# Patient Record
Sex: Male | Born: 1935
Health system: Southern US, Community
[De-identification: ages and names within clinical notes are randomized; demographics above are authoritative.]

## PROBLEM LIST (undated history)

## (undated) DIAGNOSIS — IMO0001 Reserved for inherently not codable concepts without codable children: Secondary | ICD-10-CM

## (undated) DIAGNOSIS — C801 Malignant (primary) neoplasm, unspecified: Secondary | ICD-10-CM

## (undated) DIAGNOSIS — I6529 Occlusion and stenosis of unspecified carotid artery: Secondary | ICD-10-CM

## (undated) DIAGNOSIS — F329 Major depressive disorder, single episode, unspecified: Secondary | ICD-10-CM

## (undated) DIAGNOSIS — I071 Rheumatic tricuspid insufficiency: Secondary | ICD-10-CM

## (undated) DIAGNOSIS — E785 Hyperlipidemia, unspecified: Secondary | ICD-10-CM

## (undated) DIAGNOSIS — I739 Peripheral vascular disease, unspecified: Secondary | ICD-10-CM

## (undated) DIAGNOSIS — I340 Nonrheumatic mitral (valve) insufficiency: Secondary | ICD-10-CM

## (undated) DIAGNOSIS — D649 Anemia, unspecified: Secondary | ICD-10-CM

## (undated) DIAGNOSIS — E7800 Pure hypercholesterolemia, unspecified: Secondary | ICD-10-CM

## (undated) DIAGNOSIS — F32A Depression, unspecified: Secondary | ICD-10-CM

## (undated) DIAGNOSIS — I10 Essential (primary) hypertension: Secondary | ICD-10-CM

## (undated) DIAGNOSIS — I5189 Other ill-defined heart diseases: Secondary | ICD-10-CM

## (undated) DIAGNOSIS — I700 Atherosclerosis of aorta: Secondary | ICD-10-CM

## (undated) DIAGNOSIS — I639 Cerebral infarction, unspecified: Secondary | ICD-10-CM

## (undated) HISTORY — DX: Cerebral infarction, unspecified: I63.9

## (undated) HISTORY — DX: Major depressive disorder, single episode, unspecified: F32.9

## (undated) HISTORY — PX: COLONOSCOPY: SHX174

## (undated) HISTORY — DX: Nonrheumatic mitral (valve) insufficiency: I34.0

## (undated) HISTORY — DX: Essential (primary) hypertension: I10

## (undated) HISTORY — PX: OTHER SURGICAL HISTORY: SHX169

## (undated) HISTORY — DX: Peripheral vascular disease, unspecified: I73.9

## (undated) HISTORY — DX: Hyperlipidemia, unspecified: E78.5

## (undated) HISTORY — DX: Other ill-defined heart diseases: I51.89

## (undated) HISTORY — DX: Depression, unspecified: F32.A

## (undated) HISTORY — DX: Reserved for inherently not codable concepts without codable children: IMO0001

## (undated) HISTORY — DX: Malignant (primary) neoplasm, unspecified: C80.1

## (undated) HISTORY — DX: Rheumatic tricuspid insufficiency: I07.1

## (undated) HISTORY — DX: Anemia, unspecified: D64.9

## (undated) HISTORY — DX: Atherosclerosis of aorta: I70.0

## (undated) HISTORY — DX: Occlusion and stenosis of unspecified carotid artery: I65.29

---
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
## 1978-05-18 HISTORY — PX: HERNIA REPAIR: SHX51

## 1999-12-17 ENCOUNTER — Ambulatory Visit (HOSPITAL_BASED_OUTPATIENT_CLINIC_OR_DEPARTMENT_OTHER): Admission: RE | Admit: 1999-12-17 | Discharge: 1999-12-17 | Payer: Self-pay | Admitting: Plastic Surgery

## 2000-01-28 ENCOUNTER — Ambulatory Visit (HOSPITAL_BASED_OUTPATIENT_CLINIC_OR_DEPARTMENT_OTHER): Admission: RE | Admit: 2000-01-28 | Discharge: 2000-01-28 | Payer: Self-pay | Admitting: Plastic Surgery

## 2000-01-28 ENCOUNTER — Encounter (INDEPENDENT_AMBULATORY_CARE_PROVIDER_SITE_OTHER): Payer: Self-pay | Admitting: *Deleted

## 2002-03-07 ENCOUNTER — Encounter: Admission: RE | Admit: 2002-03-07 | Discharge: 2002-06-05 | Payer: Self-pay | Admitting: Family Medicine

## 2002-04-07 ENCOUNTER — Encounter (INDEPENDENT_AMBULATORY_CARE_PROVIDER_SITE_OTHER): Payer: Self-pay

## 2002-04-07 ENCOUNTER — Ambulatory Visit (HOSPITAL_COMMUNITY): Admission: RE | Admit: 2002-04-07 | Discharge: 2002-04-07 | Payer: Self-pay | Admitting: Gastroenterology

## 2002-08-09 ENCOUNTER — Encounter: Admission: RE | Admit: 2002-08-09 | Discharge: 2002-11-07 | Payer: Self-pay | Admitting: Family Medicine

## 2005-09-25 ENCOUNTER — Emergency Department (HOSPITAL_COMMUNITY): Admission: EM | Admit: 2005-09-25 | Discharge: 2005-09-25 | Payer: Self-pay | Admitting: Emergency Medicine

## 2005-10-13 ENCOUNTER — Encounter: Admission: RE | Admit: 2005-10-13 | Discharge: 2005-10-13 | Payer: Self-pay | Admitting: Sports Medicine

## 2005-11-02 ENCOUNTER — Encounter: Admission: RE | Admit: 2005-11-02 | Discharge: 2005-11-02 | Payer: Self-pay | Admitting: Sports Medicine

## 2010-06-02 ENCOUNTER — Observation Stay (HOSPITAL_COMMUNITY)
Admission: EM | Admit: 2010-06-02 | Discharge: 2010-06-03 | Payer: Self-pay | Source: Home / Self Care | Attending: Emergency Medicine | Admitting: Emergency Medicine

## 2010-06-04 LAB — GLUCOSE, CAPILLARY: Glucose-Capillary: 167 mg/dL — ABNORMAL HIGH (ref 70–99)

## 2010-06-04 LAB — DIFFERENTIAL
Basophils Absolute: 0 10*3/uL (ref 0.0–0.1)
Basophils Relative: 0 % (ref 0–1)
Eosinophils Absolute: 0.2 10*3/uL (ref 0.0–0.7)
Eosinophils Relative: 2 % (ref 0–5)
Lymphocytes Relative: 19 % (ref 12–46)
Lymphs Abs: 1.3 10*3/uL (ref 0.7–4.0)
Monocytes Absolute: 0.4 10*3/uL (ref 0.1–1.0)
Monocytes Relative: 6 % (ref 3–12)
Neutro Abs: 4.9 10*3/uL (ref 1.7–7.7)
Neutrophils Relative %: 73 % (ref 43–77)

## 2010-06-04 LAB — COMPREHENSIVE METABOLIC PANEL
ALT: 17 U/L (ref 0–53)
AST: 17 U/L (ref 0–37)
Albumin: 3.6 g/dL (ref 3.5–5.2)
Alkaline Phosphatase: 42 U/L (ref 39–117)
BUN: 24 mg/dL — ABNORMAL HIGH (ref 6–23)
CO2: 23 mEq/L (ref 19–32)
Calcium: 9.1 mg/dL (ref 8.4–10.5)
Chloride: 103 mEq/L (ref 96–112)
Creatinine, Ser: 1.57 mg/dL — ABNORMAL HIGH (ref 0.4–1.5)
GFR calc Af Amer: 52 mL/min — ABNORMAL LOW (ref >60)
GFR calc non Af Amer: 43 mL/min — ABNORMAL LOW (ref >60)
Glucose, Bld: 153 mg/dL — ABNORMAL HIGH (ref 70–99)
Potassium: 4 mEq/L (ref 3.5–5.1)
Sodium: 140 mEq/L (ref 135–145)
Total Bilirubin: 0.2 mg/dL — ABNORMAL LOW (ref 0.3–1.2)
Total Protein: 6 g/dL (ref 6.0–8.3)

## 2010-06-04 LAB — CBC
HCT: 33.2 % — ABNORMAL LOW (ref 39.0–52.0)
Hemoglobin: 11.1 g/dL — ABNORMAL LOW (ref 13.0–17.0)
MCH: 30.5 pg (ref 26.0–34.0)
MCHC: 33.4 g/dL (ref 30.0–36.0)
MCV: 91.2 fL (ref 78.0–100.0)
Platelets: 265 10*3/uL (ref 150–400)
RBC: 3.64 MIL/uL — ABNORMAL LOW (ref 4.22–5.81)
RDW: 13.3 % (ref 11.5–15.5)
WBC: 6.8 10*3/uL (ref 4.0–10.5)

## 2010-06-04 LAB — URINALYSIS, ROUTINE W REFLEX MICROSCOPIC
Bilirubin Urine: NEGATIVE
Hgb urine dipstick: NEGATIVE
Ketones, ur: 15 mg/dL — AB
Nitrite: NEGATIVE
Protein, ur: NEGATIVE mg/dL
Specific Gravity, Urine: 1.013 (ref 1.005–1.030)
Urine Glucose, Fasting: NEGATIVE mg/dL
Urobilinogen, UA: 0.2 mg/dL (ref 0.0–1.0)
pH: 5.5 (ref 5.0–8.0)

## 2010-06-04 LAB — TROPONIN I: Troponin I: 0.04 ng/mL (ref 0.00–0.06)

## 2010-06-08 ENCOUNTER — Encounter: Payer: Self-pay | Admitting: Sports Medicine

## 2010-08-12 ENCOUNTER — Ambulatory Visit: Payer: Self-pay | Admitting: Cardiovascular Disease

## 2010-08-27 ENCOUNTER — Other Ambulatory Visit: Payer: Self-pay | Admitting: *Deleted

## 2010-08-27 DIAGNOSIS — E7800 Pure hypercholesterolemia, unspecified: Secondary | ICD-10-CM

## 2010-08-28 ENCOUNTER — Encounter: Payer: Self-pay | Admitting: Cardiovascular Disease

## 2010-08-28 DIAGNOSIS — R079 Chest pain, unspecified: Secondary | ICD-10-CM | POA: Insufficient documentation

## 2010-08-28 DIAGNOSIS — I350 Nonrheumatic aortic (valve) stenosis: Secondary | ICD-10-CM | POA: Insufficient documentation

## 2010-08-28 DIAGNOSIS — R0602 Shortness of breath: Secondary | ICD-10-CM | POA: Insufficient documentation

## 2010-08-28 DIAGNOSIS — I5189 Other ill-defined heart diseases: Secondary | ICD-10-CM | POA: Insufficient documentation

## 2010-08-28 DIAGNOSIS — E119 Type 2 diabetes mellitus without complications: Secondary | ICD-10-CM | POA: Insufficient documentation

## 2010-08-28 DIAGNOSIS — E785 Hyperlipidemia, unspecified: Secondary | ICD-10-CM | POA: Insufficient documentation

## 2010-08-28 DIAGNOSIS — I340 Nonrheumatic mitral (valve) insufficiency: Secondary | ICD-10-CM | POA: Insufficient documentation

## 2010-08-28 DIAGNOSIS — I071 Rheumatic tricuspid insufficiency: Secondary | ICD-10-CM | POA: Insufficient documentation

## 2010-08-28 DIAGNOSIS — I10 Essential (primary) hypertension: Secondary | ICD-10-CM | POA: Insufficient documentation

## 2010-08-29 ENCOUNTER — Other Ambulatory Visit (INDEPENDENT_AMBULATORY_CARE_PROVIDER_SITE_OTHER): Payer: Medicare Other | Admitting: *Deleted

## 2010-08-29 ENCOUNTER — Encounter: Payer: Self-pay | Admitting: Cardiovascular Disease

## 2010-08-29 ENCOUNTER — Ambulatory Visit (INDEPENDENT_AMBULATORY_CARE_PROVIDER_SITE_OTHER): Payer: Medicare Other | Admitting: Cardiovascular Disease

## 2010-08-29 DIAGNOSIS — E7800 Pure hypercholesterolemia, unspecified: Secondary | ICD-10-CM

## 2010-08-29 DIAGNOSIS — R079 Chest pain, unspecified: Secondary | ICD-10-CM

## 2010-08-29 LAB — LIPID PANEL
Cholesterol: 143 mg/dL (ref 0–200)
HDL: 64.9 mg/dL (ref >39.00)
LDL Cholesterol: 63 mg/dL (ref 0–99)
Total CHOL/HDL Ratio: 2
Triglycerides: 74 mg/dL (ref 0.0–149.0)
VLDL: 14.8 mg/dL (ref 0.0–40.0)

## 2010-08-29 LAB — HEPATIC FUNCTION PANEL
ALT: 23 U/L (ref 0–53)
AST: 23 U/L (ref 0–37)
Albumin: 4.2 g/dL (ref 3.5–5.2)
Alkaline Phosphatase: 44 U/L (ref 39–117)
Bilirubin, Direct: 0.1 mg/dL (ref 0.0–0.3)
Total Bilirubin: 0.9 mg/dL (ref 0.3–1.2)
Total Protein: 6.7 g/dL (ref 6.0–8.3)

## 2010-08-29 LAB — BASIC METABOLIC PANEL
BUN: 19 mg/dL (ref 6–23)
CO2: 28 mEq/L (ref 19–32)
Calcium: 9.6 mg/dL (ref 8.4–10.5)
Chloride: 102 mEq/L (ref 96–112)
Creatinine, Ser: 1.3 mg/dL (ref 0.4–1.5)
GFR: 55.94 mL/min — ABNORMAL LOW (ref >60.00)
Glucose, Bld: 112 mg/dL — ABNORMAL HIGH (ref 70–99)
Potassium: 4.3 mEq/L (ref 3.5–5.1)
Sodium: 140 mEq/L (ref 135–145)

## 2010-08-29 NOTE — Assessment & Plan Note (Signed)
Joshua York is no longer having any significant episodes of chest pain. I would like to continue with his same medications and I'll see him again in one year.

## 2010-08-29 NOTE — Progress Notes (Signed)
Joshua York Date of Birth  1928-06-12 [facility] [address]     [address] [place], [place]  [address] [phone]  Fax  [phone]  History of Present Illness:  Mr. Joshua York is an elderly gentleman with a history of atypical chest pains the past. He also has some shortness breath. He has normal left ventricular systolic function by echo. He's had a negative stress test. He's done quite well since I last saw him. He's not had any episodes of chest pain or shortness of breath.  Current Outpatient Prescriptions on File Prior to Visit  Medication Sig Dispense Refill  . amLODipine (NORVASC) 10 MG tablet Take 10 mg by mouth daily.        Marland Kitchen aspirin 81 MG tablet Take 81 mg by mouth daily.        Marland Kitchen glipiZIDE (GLUCOTROL) 10 MG tablet Take 10 mg by mouth daily.        . hydrochlorothiazide 25 MG tablet Take 25 mg by mouth daily.        Marland Kitchen latanoprost (XALATAN) 0.005 % ophthalmic solution 1 drop at bedtime.        . lovastatin (MEVACOR) 20 MG tablet Take 20 mg by mouth at bedtime.        . metFORMIN (GLUCOPHAGE) 500 MG tablet Take 500 mg by mouth daily. 2 DAILY       . olmesartan (BENICAR) 20 MG tablet Take 20 mg by mouth daily.        . pioglitazone (ACTOS) 30 MG tablet Take 30 mg by mouth daily.        . trandolapril (MAVIK) 4 MG tablet Take 4 mg by mouth daily.        Marland Kitchen venlafaxine (EFFEXOR-XR) 75 MG 24 hr capsule Take 75 mg by mouth daily.          Allergies  Allergen Reactions  . Iodinated Diagnostic Agents   . Shellfish-Derived Products     Past Medical History  Diagnosis Date  . Diabetes mellitus   . Hyperlipidemia   . Hypertension   . Dyspnea on exertion   . Chest pain   . SOB (shortness of breath)   . Diastolic dysfunction   . Mild aortic sclerosis   . MR (mitral regurgitation)     MILD  . TR (tricuspid regurgitation)     MILD    No past surgical history on file.  History  Smoking status  . Former Smoker  . Quit date:  08/27/1985  Smokeless tobacco  . Not on file    History  Alcohol Use No    Family History  Problem Relation Age of Onset  . Heart attack Father 16  . Other Mother 49    OLD AGE    Reviw of Systems:  Reviewed in the HPI.  All other systems are negative.  Physical Exam: BP 152/78  Pulse 58  Ht 5\' 9"  (1.753 m)  Wt 166 lb 3.2 oz (75.388 kg)  BMI 24.54 kg/m2 The patient is alert and oriented x 3.  The mood and affect are normal.  The skin is warm and dry.  Color is normal.  The HEENT exam reveals that the sclera are nonicteric.  The mucous membranes are moist.  The carotids are 2+ without bruits.  There is no thyromegaly.  There is no JVD.  The lungs are clear.  The chest wall is non tender.  The heart exam reveals a regular rate with  a normal S1 and S2.  There are no murmurs, gallops, or rubs.  The PMI is not displaced.   Abdominal exam reveals good bowel sounds.  There is no guarding or rebound.  There is no hepatosplenomegaly or tenderness.  There are no masses.  Exam of the legs reveal no clubbing, cyanosis, or edema.  The legs are without rashes.  The distal pulses are intact.  Cranial nerves II - XII are intact.  Motor and sensory functions are intact.  The gait is normal.  ECG: Normal sinus rhythm. He has T wave inversions in leads 3 and aVF which were unchanged from previous tracings. Assessment / Plan:

## 2010-09-01 ENCOUNTER — Telehealth: Payer: Self-pay | Admitting: *Deleted

## 2010-09-01 NOTE — Telephone Encounter (Signed)
Message copied by Mahalia Longest on Mon Sep 01, 2010 12:21 PM ------      Message from: [place], [place]      Created: Fri Aug 29, 2010  4:52 PM       Randie Heinz

## 2010-09-01 NOTE — Telephone Encounter (Signed)
Patient called with lab results. Copy mailed. Pt verbalized understanding. Alfonso Ramus RN

## 2010-10-03 NOTE — Op Note (Signed)
   NAME:  Joshua York, Joshua York                          ACCOUNT NO.:  [id number]   MEDICAL RECORD NO.:  [id number]                   PATIENT TYPE:  AMB   LOCATION:  ENDO                                 FACILITY:  [facility]   PHYSICIAN:  Bernette Redbird, M.D.                DATE OF BIRTH:  [birth year]   DATE OF PROCEDURE:  04/07/2002  DATE OF DISCHARGE:                                 OPERATIVE REPORT   PROCEDURE:  Colonoscopy with biopsy.   INDICATIONS FOR PROCEDURE:  A 77 year old gentleman with small polyp seen on  screening flexible sigmoidoscopy.   FINDINGS:  Essentially normal exam. Tiny hyperplastic appearing polyps in  the rectosigmoid area.   DESCRIPTION OF PROCEDURE:  The nature, purpose and risk of the procedure had  been reviewed with the patient who provided written consent. The Olympus  adjustable tension pediatric video colonoscope was advanced to the cecum as  identified by clear visualization of the appendiceal orifice and pullback  was then performed. The quality of the prep was excellent and it is felt  that all areas were well seen.   At about 18 cm, near the rectosigmoid junction, I encountered several tiny 2  mm hyperplastic appearing sessile polyps which were flattened out with  insufflation. I elected to biopsy several of these for histologic analysis  although it is anticipated they will probably be hyperplastic in character.  No large polyps, cancer, colitis or vascular malformations were observed.  Some mild diverticulosis may have been present but there was no extensive  diverticular change.   The patient tolerated the procedure well and there were no apparent  complications.   IMPRESSION:  Essentially normal examination. Tiny polyps biopsied as  described above.   PLAN:  Await pathology.                                               Bernette Redbird, M.D.    RB/MEDQ  D:  04/07/2002  T:  04/07/2002  Job:  [id number]   cc:   Caryn Bee L. Little, M.D.  [address]  [place]  [place] [address]  Fax: [phone]

## 2010-10-31 ENCOUNTER — Other Ambulatory Visit: Payer: Self-pay | Admitting: Family Medicine

## 2010-10-31 DIAGNOSIS — R55 Syncope and collapse: Secondary | ICD-10-CM

## 2010-11-03 ENCOUNTER — Ambulatory Visit (HOSPITAL_COMMUNITY): Payer: Medicare Other | Attending: Family Medicine

## 2010-11-03 DIAGNOSIS — E785 Hyperlipidemia, unspecified: Secondary | ICD-10-CM | POA: Insufficient documentation

## 2010-11-03 DIAGNOSIS — R55 Syncope and collapse: Secondary | ICD-10-CM | POA: Insufficient documentation

## 2010-11-03 DIAGNOSIS — R0609 Other forms of dyspnea: Secondary | ICD-10-CM | POA: Insufficient documentation

## 2010-11-03 DIAGNOSIS — I10 Essential (primary) hypertension: Secondary | ICD-10-CM | POA: Insufficient documentation

## 2010-11-03 DIAGNOSIS — R0989 Other specified symptoms and signs involving the circulatory and respiratory systems: Secondary | ICD-10-CM | POA: Insufficient documentation

## 2010-11-03 DIAGNOSIS — E119 Type 2 diabetes mellitus without complications: Secondary | ICD-10-CM | POA: Insufficient documentation

## 2010-11-03 DIAGNOSIS — I059 Rheumatic mitral valve disease, unspecified: Secondary | ICD-10-CM | POA: Insufficient documentation

## 2010-11-03 DIAGNOSIS — R079 Chest pain, unspecified: Secondary | ICD-10-CM | POA: Insufficient documentation

## 2010-11-03 DIAGNOSIS — I079 Rheumatic tricuspid valve disease, unspecified: Secondary | ICD-10-CM | POA: Insufficient documentation

## 2010-11-04 ENCOUNTER — Other Ambulatory Visit (HOSPITAL_COMMUNITY): Payer: Self-pay | Admitting: Radiology

## 2010-11-04 ENCOUNTER — Encounter: Payer: Self-pay | Admitting: Cardiology

## 2010-11-04 ENCOUNTER — Encounter (HOSPITAL_COMMUNITY): Payer: Self-pay | Admitting: Cardiovascular Disease

## 2010-11-04 DIAGNOSIS — R55 Syncope and collapse: Secondary | ICD-10-CM

## 2010-11-06 NOTE — Progress Notes (Signed)
Pt informed of normal results.

## 2010-11-07 ENCOUNTER — Ambulatory Visit
Admission: RE | Admit: 2010-11-07 | Discharge: 2010-11-07 | Disposition: A | Discharge status: Home / Self Care | Payer: Medicare Other | Source: Ambulatory Visit | Attending: Family Medicine | Admitting: Family Medicine

## 2010-11-07 DIAGNOSIS — R55 Syncope and collapse: Secondary | ICD-10-CM

## 2010-11-25 ENCOUNTER — Other Ambulatory Visit: Payer: Self-pay | Admitting: Dermatology

## 2010-12-11 ENCOUNTER — Other Ambulatory Visit: Payer: Self-pay | Admitting: Neurology

## 2010-12-11 DIAGNOSIS — R55 Syncope and collapse: Secondary | ICD-10-CM

## 2010-12-16 ENCOUNTER — Ambulatory Visit
Admission: RE | Admit: 2010-12-16 | Discharge: 2010-12-16 | Disposition: A | Discharge status: Home / Self Care | Payer: Medicare Other | Source: Ambulatory Visit | Attending: Neurology | Admitting: Neurology

## 2010-12-16 DIAGNOSIS — R55 Syncope and collapse: Secondary | ICD-10-CM

## 2011-03-22 ENCOUNTER — Other Ambulatory Visit: Payer: Self-pay

## 2011-03-22 ENCOUNTER — Emergency Department (HOSPITAL_COMMUNITY): Payer: Medicare Other

## 2011-03-22 ENCOUNTER — Encounter (HOSPITAL_COMMUNITY): Payer: Self-pay | Admitting: Nurse Practitioner

## 2011-03-22 ENCOUNTER — Inpatient Hospital Stay (HOSPITAL_COMMUNITY)
Admission: EM | Admit: 2011-03-22 | Discharge: 2011-03-23 | DRG: 101 | Disposition: A | Discharge status: Home / Self Care | Payer: Medicare Other | Attending: Internal Medicine | Admitting: Internal Medicine

## 2011-03-22 DIAGNOSIS — I340 Nonrheumatic mitral (valve) insufficiency: Secondary | ICD-10-CM | POA: Diagnosis present

## 2011-03-22 DIAGNOSIS — I679 Cerebrovascular disease, unspecified: Secondary | ICD-10-CM | POA: Diagnosis present

## 2011-03-22 DIAGNOSIS — I2510 Atherosclerotic heart disease of native coronary artery without angina pectoris: Secondary | ICD-10-CM | POA: Diagnosis present

## 2011-03-22 DIAGNOSIS — E785 Hyperlipidemia, unspecified: Secondary | ICD-10-CM | POA: Diagnosis present

## 2011-03-22 DIAGNOSIS — E119 Type 2 diabetes mellitus without complications: Secondary | ICD-10-CM | POA: Diagnosis present

## 2011-03-22 DIAGNOSIS — I079 Rheumatic tricuspid valve disease, unspecified: Secondary | ICD-10-CM | POA: Diagnosis present

## 2011-03-22 DIAGNOSIS — I059 Rheumatic mitral valve disease, unspecified: Secondary | ICD-10-CM | POA: Diagnosis present

## 2011-03-22 DIAGNOSIS — IMO0001 Reserved for inherently not codable concepts without codable children: Secondary | ICD-10-CM | POA: Diagnosis present

## 2011-03-22 DIAGNOSIS — I10 Essential (primary) hypertension: Secondary | ICD-10-CM | POA: Diagnosis present

## 2011-03-22 DIAGNOSIS — R4182 Altered mental status, unspecified: Secondary | ICD-10-CM | POA: Diagnosis present

## 2011-03-22 DIAGNOSIS — Z66 Do not resuscitate: Secondary | ICD-10-CM | POA: Diagnosis present

## 2011-03-22 DIAGNOSIS — I350 Nonrheumatic aortic (valve) stenosis: Secondary | ICD-10-CM | POA: Diagnosis present

## 2011-03-22 DIAGNOSIS — R569 Unspecified convulsions: Principal | ICD-10-CM | POA: Diagnosis present

## 2011-03-22 DIAGNOSIS — Z888 Allergy status to other drugs, medicaments and biological substances status: Secondary | ICD-10-CM

## 2011-03-22 DIAGNOSIS — R0609 Other forms of dyspnea: Secondary | ICD-10-CM | POA: Diagnosis not present

## 2011-03-22 DIAGNOSIS — I359 Nonrheumatic aortic valve disorder, unspecified: Secondary | ICD-10-CM | POA: Diagnosis present

## 2011-03-22 DIAGNOSIS — I071 Rheumatic tricuspid insufficiency: Secondary | ICD-10-CM | POA: Diagnosis present

## 2011-03-22 DIAGNOSIS — G459 Transient cerebral ischemic attack, unspecified: Secondary | ICD-10-CM | POA: Diagnosis present

## 2011-03-22 DIAGNOSIS — I5189 Other ill-defined heart diseases: Secondary | ICD-10-CM | POA: Diagnosis not present

## 2011-03-22 DIAGNOSIS — Z87891 Personal history of nicotine dependence: Secondary | ICD-10-CM

## 2011-03-22 HISTORY — DX: Pure hypercholesterolemia, unspecified: E78.00

## 2011-03-22 LAB — CBC
HCT: 34 % — ABNORMAL LOW (ref 39.0–52.0)
Hemoglobin: 12 g/dL — ABNORMAL LOW (ref 13.0–17.0)
MCH: 31.9 pg (ref 26.0–34.0)
MCHC: 35.3 g/dL (ref 30.0–36.0)
MCHC: 34.5 g/dL (ref 30.0–36.0)
MCV: 90.4 fL (ref 78.0–100.0)
RDW: 12.8 % (ref 11.5–15.5)
RDW: 12.7 % (ref 11.5–15.5)
WBC: 9.9 10*3/uL (ref 4.0–10.5)

## 2011-03-22 LAB — DIFFERENTIAL
Basophils Relative: 0 % (ref 0–1)
Eosinophils Relative: 2 % (ref 0–5)
Monocytes Absolute: 0.6 10*3/uL (ref 0.1–1.0)
Monocytes Relative: 5 % (ref 3–12)
Neutro Abs: 9.4 10*3/uL — ABNORMAL HIGH (ref 1.7–7.7)

## 2011-03-22 LAB — COMPREHENSIVE METABOLIC PANEL
Albumin: 4.3 g/dL (ref 3.5–5.2)
BUN: 24 mg/dL — ABNORMAL HIGH (ref 6–23)
CO2: 27 mEq/L (ref 19–32)
Calcium: 10.3 mg/dL (ref 8.4–10.5)
Chloride: 93 mEq/L — ABNORMAL LOW (ref 96–112)
Creatinine, Ser: 1.21 mg/dL (ref 0.50–1.35)
GFR calc non Af Amer: 55 mL/min — ABNORMAL LOW (ref >90)
Total Bilirubin: 0.2 mg/dL — ABNORMAL LOW (ref 0.3–1.2)

## 2011-03-22 LAB — TROPONIN I: Troponin I: <0.3 ng/mL (ref <0.30)

## 2011-03-22 LAB — GLUCOSE, CAPILLARY
Glucose-Capillary: 166 mg/dL — ABNORMAL HIGH (ref 70–99)
Glucose-Capillary: 137 mg/dL — ABNORMAL HIGH (ref 70–99)

## 2011-03-22 MED ORDER — GLIPIZIDE 10 MG PO TABS
10.0000 mg | ORAL_TABLET | ORAL | Status: DC
  Administered 2011-03-23: 10 mg via ORAL
  Filled 2011-03-22 (×2): qty 1

## 2011-03-22 MED ORDER — ENOXAPARIN SODIUM 40 MG/0.4ML ~~LOC~~ SOLN
40.0000 mg | SUBCUTANEOUS | Status: DC
  Administered 2011-03-23: 40 mg via SUBCUTANEOUS
  Filled 2011-03-22 (×3): qty 0.4

## 2011-03-22 MED ORDER — SIMVASTATIN 10 MG PO TABS
10.0000 mg | ORAL_TABLET | Freq: Every day | ORAL | Status: DC
  Administered 2011-03-22: 10 mg via ORAL
  Filled 2011-03-22 (×2): qty 1

## 2011-03-22 MED ORDER — SODIUM CHLORIDE 0.9 % IJ SOLN
3.0000 mL | Freq: Two times a day (BID) | INTRAMUSCULAR | Status: DC
  Administered 2011-03-22 – 2011-03-23 (×2): 3 mL via INTRAVENOUS

## 2011-03-22 MED ORDER — LATANOPROST 0.005 % OP SOLN
1.0000 [drp] | Freq: Every day | OPHTHALMIC | Status: DC
  Administered 2011-03-22: 1 [drp] via OPHTHALMIC
  Filled 2011-03-22: qty 2.5

## 2011-03-22 MED ORDER — ASPIRIN 325 MG PO TABS
325.0000 mg | ORAL_TABLET | Freq: Every evening | ORAL | Status: DC
  Filled 2011-03-22: qty 1

## 2011-03-22 MED ORDER — SODIUM CHLORIDE 0.9 % IJ SOLN: 3.0000 mL | INTRAMUSCULAR | Status: DC | PRN

## 2011-03-22 MED ORDER — PIOGLITAZONE HCL 30 MG PO TABS
30.0000 mg | ORAL_TABLET | ORAL | Status: DC
  Administered 2011-03-23: 30 mg via ORAL
  Filled 2011-03-22 (×2): qty 1

## 2011-03-22 NOTE — ED Notes (Addendum)
Per ems: pt wife states slurred speech and altered mental status onset this afternoon and then she witnessed a seizure. Seizure approx 10-15 seconds, pt reports new seizures over past months with no clinical diagnosis. A&Ox4 en route, no seizure activity en route, no injuries noted.

## 2011-03-22 NOTE — ED Provider Notes (Addendum)
History     CSN: [id number] Arrival date & time: 03/22/2011  6:29 PM   First MD Initiated Contact with Patient 03/22/11 1849      Chief Complaint  Patient presents with  . Seizures    (Consider location/radiation/quality/duration/timing/severity/associated sxs/prior treatment) Patient is a 77 y.o. male presenting with seizures. The history is provided by the patient and the spouse.  Seizures  This is a new problem. The current episode started less than 1 hour ago. The problem has been resolved. There was 1 seizure. The most recent episode lasted less than 30 seconds. Associated symptoms include confusion. Pertinent negatives include no sleepiness, no headaches, no visual disturbance, no neck stiffness, no chest pain, no cough, no nausea, no vomiting and no diarrhea. Characteristics include bladder incontinence, rhythmic jerking and loss of consciousness. Characteristics do not include eye blinking, apnea or cyanosis. The episode was witnessed. There was no sensation of an aura present. The seizures did not continue in the ED. The seizure(s) had no focality. Possible causes do not include med or dosage change.  Prior to episode pt reports that he had abrupt onset of slurred speech, inability to remove pants to go to bathroom, then began having seizure.    Past Medical History  Diagnosis Date  . Diabetes mellitus   . Hyperlipidemia   . Hypertension   . Dyspnea on exertion   . Chest pain   . SOB (shortness of breath)   . Diastolic dysfunction   . Mild aortic sclerosis   . MR (mitral regurgitation)     MILD  . TR (tricuspid regurgitation)     MILD  . Glaucoma   . Hypercholesteremia     History reviewed. No pertinent past surgical history.  Family History  Problem Relation Age of Onset  . Heart attack Father 64  . Other Mother 85    OLD AGE    History  Substance Use Topics  . Smoking status: Former Smoker    Quit date: 08/27/1985  . Smokeless tobacco: Not on file  .  Alcohol Use: No      Review of Systems  Constitutional: Positive for activity change. Negative for fever.  HENT: Negative for neck stiffness.   Eyes: Negative for visual disturbance.  Respiratory: Negative for apnea and cough.   Cardiovascular: Negative for chest pain and cyanosis.  Gastrointestinal: Negative for nausea, vomiting and diarrhea.  Genitourinary: Positive for bladder incontinence.  Skin: Negative for rash.  Neurological: Positive for seizures and loss of consciousness. Negative for headaches.  Psychiatric/Behavioral: Positive for confusion.  All other systems reviewed and are negative.    Allergies  Iodinated diagnostic agents and Shellfish-derived products  Home Medications   Current Outpatient Rx  Name Route Sig Dispense Refill  . AMLODIPINE BESYLATE 10 MG PO TABS Oral Take 10 mg by mouth every morning.     . ASPIRIN 325 MG PO TABS Oral Take 325 mg by mouth every evening.      Marland Kitchen GLIPIZIDE 10 MG PO TABS Oral Take 10 mg by mouth every morning.     Marland Kitchen HYDROCHLOROTHIAZIDE 25 MG PO TABS Oral Take 25 mg by mouth every morning.     Marland Kitchen LATANOPROST 0.005 % OP SOLN Both Eyes Place 1 drop into both eyes at bedtime.     Marland Kitchen LOVASTATIN 20 MG PO TABS Oral Take 20 mg by mouth every evening.     Marland Kitchen METFORMIN HCL 500 MG PO TABS Oral Take 1,000 mg by mouth 2 (two) times daily.     Marland Kitchen  OLMESARTAN MEDOXOMIL 20 MG PO TABS Oral Take 20 mg by mouth every morning.     Marland Kitchen PIOGLITAZONE HCL 30 MG PO TABS Oral Take 30 mg by mouth every morning.     . TRANDOLAPRIL 4 MG PO TABS Oral Take 4 mg by mouth every morning.       BP 135/58  Pulse 76  Temp(Src) 97.8 F (36.6 C) (Oral)  Resp 14  SpO2 98%  Physical Exam  Nursing note and vitals reviewed. Constitutional: He is oriented to person, place, and time. He appears well-developed and well-nourished. No distress.  HENT:  Head: Normocephalic and atraumatic.  Eyes: EOM are normal. Pupils are equal, round, and reactive to light.  Neck: Normal  range of motion. Neck supple.  Cardiovascular: Normal rate and regular rhythm.   Pulmonary/Chest: Effort normal and breath sounds normal. No respiratory distress.  Abdominal: Soft. There is no tenderness.  Musculoskeletal: Normal range of motion. He exhibits no edema and no tenderness.  Neurological: He is alert and oriented to person, place, and time. No cranial nerve deficit.  Skin: Skin is warm and dry. No erythema.    ED Course  Procedures (including critical care time)  Labs Reviewed  CBC - Abnormal; Notable for the following:    WBC 11.3 (*)    RBC 3.76 (*)    Hemoglobin 12.0 (*)    HCT 34.0 (*)    All other components within normal limits  DIFFERENTIAL - Abnormal; Notable for the following:    Neutrophils Relative 83 (*)    Neutro Abs 9.4 (*)    Lymphocytes Relative 9 (*)    All other components within normal limits  COMPREHENSIVE METABOLIC PANEL - Abnormal; Notable for the following:    Sodium 132 (*)    Chloride 93 (*)    Glucose, Bld 175 (*)    BUN 24 (*)    Total Bilirubin 0.2 (*)    GFR calc non Af Amer 55 (*)    GFR calc Af Amer 63 (*)    All other components within normal limits  GLUCOSE, CAPILLARY - Abnormal; Notable for the following:    Glucose-Capillary 166 (*)    All other components within normal limits  POCT CBG MONITORING   Ct Head Wo Contrast  03/22/2011  *RADIOLOGY REPORT*  Clinical Data: Seizure  CT HEAD WITHOUT CONTRAST  Technique:  Contiguous axial images were obtained from the base of the skull through the vertex without contrast.  Comparison: 11/07/2010  Findings: Global atrophy and chronic ischemic changes are stable. There is no mass effect, midline shift, or acute intracranial hemorrhage.  Mastoid air cells and visualized paranasal sinuses are clear.  IMPRESSION: No acute intracranial pathology.  Chronic changes.  Original Report Authenticated By: Donavan Burnet, M.D.     1. Transient ischemic attack       MDM  Pt presented due to  slurred speech abrupt onset that has no resolved, weakness, unable to undo pants and seizure like activity with incontinence.  Hx of similar episodes has seen neuro in past with workup concerned for posterior circulation problems per pt.  Labs unremarkable, CT head neg.  Consulted hospitalist.  They will admit pt        Jacqulynn Cadet, MD Resident 03/22/11 1610  Jacqulynn Cadet, MD Resident 03/22/11 [phone]

## 2011-03-22 NOTE — H&P (Signed)
PCP:  Mickie Hillier, MD   DOA:  03/22/2011  6:29 PM  Chief Complaint:  -Slurring of speech with blurring of vision and unsteady gait x 1 day  -shaking with urinary incontinence x 1 day  HPI: Joshua York is an 77 y/o pleasant male with hx fo HTN, Type DM ( on oral hypoglycemics) , gr 1 diastolic dysfunction, Hyperlipidemia who was in his usual this health this evening when he noticed blurry vision associated with slurring of speech while he was sitting on a couch. He felt very weak and tried getting up to go to bed but was unsteady and during this time had jerky movements of his hands and also had urinary incontinence. Patient is able to recall the whole incident. He informs that these symptoms of blurry vision with slurred speech lasted approximately 10 minutes and had a very short episode of shaking of his hands. He denies any fall or trauma. Denies any weakness of his arms or legs thereafter. He denies any dizziness except for feeling unsteady. denies any headache , nausea, vomiting, photophobia, tinnitus or diplopia. He denies any chest pain, SOB, palpitations, abdominal pain . Patient and his wife who was present at bedside during conversation inform that he had similar symptoms of unsteadiness and ? syncope earlier this year in January and was admitted here and thought to be due to dehydration. He had similar episode of with drooling of saliva back in June for which he had head CT and MRI done along with ? EEG by his neurologist ( Dr Anne Hahn) but is unaware of the results. He denies drooling of saliva or tongue bite during the event. He denies having any urinary incontinence in past although does have symptoms of urgency, hesitancy and nocturia. . He had a light breakfast this morning and says his fingersticks run between 90-120s. He had his blood sugar checked by EMT and reported to be 78.  Patient denies any symptoms at this time. He informs taking good fluid intake and being compliant with diet  or medications. He informs having some URI symptoms with runny nose but denies any fever or headache.   Allergies: Allergies  Allergen Reactions  . Iodinated Diagnostic Agents   . Shellfish-Derived Products     Prior to Admission medications   Medication Sig Start Date End Date Taking? Authorizing Provider  amLODipine (NORVASC) 10 MG tablet Take 10 mg by mouth every morning.    Yes Historical Provider, MD  aspirin 325 MG tablet Take 325 mg by mouth every evening.     Yes Historical Provider, MD  glipiZIDE (GLUCOTROL) 10 MG tablet Take 10 mg by mouth every morning.    Yes Historical Provider, MD  hydrochlorothiazide 25 MG tablet Take 25 mg by mouth every morning.    Yes Historical Provider, MD  latanoprost (XALATAN) 0.005 % ophthalmic solution Place 1 drop into both eyes at bedtime.    Yes Historical Provider, MD  lovastatin (MEVACOR) 20 MG tablet Take 20 mg by mouth every evening.    Yes Historical Provider, MD  metFORMIN (GLUCOPHAGE) 500 MG tablet Take 1,000 mg by mouth 2 (two) times daily.    Yes Historical Provider, MD  olmesartan (BENICAR) 20 MG tablet Take 20 mg by mouth every morning.    Yes Historical Provider, MD  pioglitazone (ACTOS) 30 MG tablet Take 30 mg by mouth every morning.    Yes Historical Provider, MD  trandolapril (MAVIK) 4 MG tablet Take 4 mg by mouth every morning.  Yes Historical Provider, MD    Past Medical History  Diagnosis Date  . Diabetes mellitus   . Hyperlipidemia   . Hypertension   . Dyspnea on exertion   . Chest pain   . SOB (shortness of breath)   . Diastolic dysfunction   . Mild aortic sclerosis   . Joshua (mitral regurgitation)     MILD  . TR (tricuspid regurgitation)     MILD  . Glaucoma   . Hypercholesteremia     Past surgical hx:  hernia repair remotely Hx of fracture of left cheek bone with sx several yrs back  Social History: Smoked 1 and half PPD for over 10 yrs,  reports that he quit smoking about 25 years ago.drinks 1-2 beer  daily. deneis drinking today. Lives with his wife. He is fairly active and walks a mile daily.  Family History  Problem Relation Age of Onset  . Heart attack Father 30  . Other Mother 57    OLD AGE    Review of Systems:  Constitutional: Denies fever, chills, diaphoresis, appetite change   HEENT: Denies photophobia, eye pain, redness, hearing loss, ear pain, congestion, sore throat,, sneezing, mouth sores, trouble swallowing, neck pain, neck stiffness and tinnitus.   Respiratory: Denies SOB, DOE, cough, chest tightness,  and wheezing.   Cardiovascular: Denies chest pain, palpitations and leg swelling.  Gastrointestinal: Denies nausea, vomiting, abdominal pain, diarrhea, constipation, blood in stool and abdominal distention.  Genitourinary: Denies dysuria,  hematuria, flank pain .  Musculoskeletal: Denies myalgias, back pain, joint swelling, arthralgias and gait problem.  Skin: Denies pallor, rash and wound.  Neurological: Denies dizziness, , syncope, , light-headedness, numbness and headaches.     Physical Exam:  Filed Vitals:   03/22/11 1845 03/22/11 2056  BP: 135/58 127/84  Pulse: 76 75  Temp: 97.8 F (36.6 C) 98 F (36.7 C)  TempSrc: Oral Oral  Resp: 14 21  SpO2: 98% 97%    Constitutional: Vital signs reviewed.  Patient is a well-developed and well-nourished in no acute distress  HEENT: no pallor, no icterus, PEERLA, EOMI, moist oral mucosa Cardiovascular: RRR, S1 normal, S2 normal, no Murmurs , rubs or gallop Pulmonary/Chest: CTAB, no wheezes, rales, or rhonchi Abdominal: Soft. Non-tender, non-distended, bowel sounds are normal,  ZOX:WRUE , no edema Neurological: A&O x3, Strenght is normal and symmetric bilaterally, normal tone and reflexes, cranial nerve II-XII are grossly intact, no focal motor deficit, normal sensations, cerebellar fn intact, gait steady, rhomberg's sign negative, no neck rigidity   Labs on Admission:  Results for orders placed during the hospital  encounter of 03/22/11 (from the past 48 hour(s))  CBC     Status: Abnormal   Collection Time   03/22/11  7:37 PM      Component Value Range Comment   WBC 11.3 (*) 4.0 - 10.5 (K/uL)    RBC 3.76 (*) 4.22 - 5.81 (MIL/uL)    Hemoglobin 12.0 (*) 13.0 - 17.0 (g/dL)    HCT 45.4 (*) 09.8 - 52.0 (%)    MCV 90.4  78.0 - 100.0 (fL)    MCH 31.9  26.0 - 34.0 (pg)    MCHC 35.3  30.0 - 36.0 (g/dL)    RDW 11.9  14.7 - 82.9 (%)    Platelets 252  150 - 400 (K/uL)   DIFFERENTIAL     Status: Abnormal   Collection Time   03/22/11  7:37 PM      Component Value Range Comment   Neutrophils Relative  83 (*) 43 - 77 (%)    Neutro Abs 9.4 (*) 1.7 - 7.7 (K/uL)    Lymphocytes Relative 9 (*) 12 - 46 (%)    Lymphs Abs 1.0  0.7 - 4.0 (K/uL)    Monocytes Relative 5  3 - 12 (%)    Monocytes Absolute 0.6  0.1 - 1.0 (K/uL)    Eosinophils Relative 2  0 - 5 (%)    Eosinophils Absolute 0.2  0.0 - 0.7 (K/uL)    Basophils Relative 0  0 - 1 (%)    Basophils Absolute 0.0  0.0 - 0.1 (K/uL)   COMPREHENSIVE METABOLIC PANEL     Status: Abnormal   Collection Time   03/22/11  7:37 PM      Component Value Range Comment   Sodium 132 (*) 135 - 145 (mEq/L)    Potassium 5.0  3.5 - 5.1 (mEq/L)    Chloride 93 (*) 96 - 112 (mEq/L)    CO2 27  19 - 32 (mEq/L)    Glucose, Bld 175 (*) 70 - 99 (mg/dL)    BUN 24 (*) 6 - 23 (mg/dL)    Creatinine, Ser 1.61  0.50 - 1.35 (mg/dL)    Calcium 09.6  8.4 - 10.5 (mg/dL)    Total Protein 7.0  6.0 - 8.3 (g/dL)    Albumin 4.3  3.5 - 5.2 (g/dL)    AST 20  0 - 37 (U/L)    ALT 18  0 - 53 (U/L)    Alkaline Phosphatase 51  39 - 117 (U/L)    Total Bilirubin 0.2 (*) 0.3 - 1.2 (mg/dL)    GFR calc non Af Amer 55 (*) >90 (mL/min)    GFR calc Af Amer 63 (*) >90 (mL/min)   GLUCOSE, CAPILLARY     Status: Abnormal   Collection Time   03/22/11  7:45 PM      Component Value Range Comment   Glucose-Capillary 166 (*) 70 - 99 (mg/dL)   GLUCOSE, CAPILLARY     Status: Abnormal   Collection Time   03/22/11  9:34  PM      Component Value Range Comment   Glucose-Capillary 137 (*) 70 - 99 (mg/dL)     Radiological Exams on Admission: HEAD CT: no acute neurological abnormality  EKG: NSR @ 79 TWI in III and aVF   Assessment: 77 y/o male with hx of HTN, HL, DM type 2 , grade 1 diastolic dysfunction on recent echo presenting with blurring of vision associated with slurry speech lasting about 10 minutes along with jerky movements and urinary  incontinence. vitals  and initial labs including EKG unremarkable and head CT negative for acute intracranial event. patient admitted to medical telemetry for r/o acute seizures vs TIA.  PLAN:  ? Acute seizures:  -patient to be admitted to medical floor on telemetry -Will monitor closely under seizures precautions - unclear underlying cause of seizures  But he does give hx of of similar symptoms ( with confusion and drooliong of saliva) few months back with unremarkable 2 D echo, carotifd doppler and MRI/ MRA . The MRI done did mention of a chronic hemorrhage over left internal capsule. He infomrs having an EEG done by his neurologist, which i do not see in our system.  -i will hold off on any anti AED at this time and monitor for now. - i will order an EEG and have also called neurology on call who will evalaute patient tomorrow. recommended getting MRI brain  aling with MRA head and neck which i have ordered.  -there is a possibility of his symptms to be related to hypoglycemia with his fsg of 78 as moniotred by EMS and needs to be closely monitored. i will order an A1C level. -will check a UA as well.   ? TIA -Admit to telemetry  -will check for cardiac enzymes  -continue ASA -Continue lovastatin ( substituted with zocor) -Holding BP meds for now to allow permissive HTN until MRI done -neurochecksq as per protocol -MRI brain, MRA head and neck. -Will hold off on echo and carotid doppler as they were recently done without significant findings.  -neurology eval  in am  Diabetes mellitus type 2  -monitor POCs closely - cont actos and glipizide  -holding metformin  -A1C in am -Lipid panel in am  HTN: stable, holding BP meds for now for permissive HTN with concern for CVA  Hyperlipidemia  check lipid panel in am labs -cont zocor while in hospital  DVT prophylaxis:  Sq lovenox  Diet: cardiac   Code status: DNR/ DNI ( as confirmed by patient and his wife)  Primary contact : Kathie Rhodes Jhaveri ( wife) [phone]   Time Spent on Admission: 45 minutes  Suhana Wilner 03/22/2011, 10:17 PM

## 2011-03-22 NOTE — ED Notes (Signed)
Seizure pads placed on bed

## 2011-03-22 NOTE — ED Notes (Signed)
Pt ambulated to bathroom w/ tech

## 2011-03-22 NOTE — ED Notes (Signed)
Admitting physician at bedside

## 2011-03-23 ENCOUNTER — Other Ambulatory Visit (HOSPITAL_COMMUNITY): Payer: Medicare Other

## 2011-03-23 ENCOUNTER — Inpatient Hospital Stay (HOSPITAL_COMMUNITY): Payer: Medicare Other

## 2011-03-23 ENCOUNTER — Other Ambulatory Visit: Payer: Self-pay | Admitting: Internal Medicine

## 2011-03-23 ENCOUNTER — Encounter (HOSPITAL_COMMUNITY): Payer: Self-pay | Admitting: *Deleted

## 2011-03-23 DIAGNOSIS — R4182 Altered mental status, unspecified: Secondary | ICD-10-CM | POA: Diagnosis present

## 2011-03-23 DIAGNOSIS — I679 Cerebrovascular disease, unspecified: Secondary | ICD-10-CM | POA: Diagnosis present

## 2011-03-23 LAB — TROPONIN I: Troponin I: <0.3 ng/mL (ref <0.30)

## 2011-03-23 LAB — CREATININE, SERUM
Creatinine, Ser: 1.16 mg/dL (ref 0.50–1.35)
GFR calc non Af Amer: 58 mL/min — ABNORMAL LOW (ref >90)

## 2011-03-23 LAB — URINALYSIS, ROUTINE W REFLEX MICROSCOPIC
Glucose, UA: NEGATIVE mg/dL
Specific Gravity, Urine: 1.006 (ref 1.005–1.030)
pH: 7 (ref 5.0–8.0)

## 2011-03-23 LAB — GLUCOSE, CAPILLARY
Glucose-Capillary: 112 mg/dL — ABNORMAL HIGH (ref 70–99)
Glucose-Capillary: 94 mg/dL (ref 70–99)
Glucose-Capillary: 171 mg/dL — ABNORMAL HIGH (ref 70–99)

## 2011-03-23 MED ORDER — GADOBENATE DIMEGLUMINE 529 MG/ML IV SOLN
15.0000 mL | Freq: Once | INTRAVENOUS | Status: AC
  Administered 2011-03-23: 15 mL via INTRAVENOUS

## 2011-03-23 NOTE — Discharge Summary (Signed)
DISCHARGE SUMMARY  Joshua York  MR#: [id number]  DOB:1928-06-19  Date of Admission: 03/22/2011 Date of Discharge: 03/23/2011  Attending Physician:Aristea Posada K  Patient's NWG:[id number],YQMVH Juel Burrow, MD  Consults:  neurology for EEG  Discharge Diagnoses: Present on Admission:  .Hyperlipidemia .Hypertension .Aortic stenosis .Mild aortic sclerosis .MR (mitral regurgitation) .TR (tricuspid regurgitation) .Diabetes mellitus type II, controlled .Seizures .Altered mental status .Cerebrovascular disease    Current Discharge Medication List    CONTINUE these medications which have NOT CHANGED   Details  amLODipine (NORVASC) 10 MG tablet Take 10 mg by mouth every morning.     aspirin 325 MG tablet Take 325 mg by mouth every evening.      glipiZIDE (GLUCOTROL) 10 MG tablet Take 10 mg by mouth every morning.     hydrochlorothiazide 25 MG tablet Take 25 mg by mouth every morning.     latanoprost (XALATAN) 0.005 % ophthalmic solution Place 1 drop into both eyes at bedtime.     lovastatin (MEVACOR) 20 MG tablet Take 20 mg by mouth every evening.     metFORMIN (GLUCOPHAGE) 500 MG tablet Take 1,000 mg by mouth 2 (two) times daily.     olmesartan (BENICAR) 20 MG tablet Take 20 mg by mouth every morning.     pioglitazone (ACTOS) 30 MG tablet Take 30 mg by mouth every morning.     trandolapril (MAVIK) 4 MG tablet Take 4 mg by mouth every morning.           Hospital Course: Present on Admission:  .Hyperlipidemia .Hypertension .Aortic stenosis .Mild aortic sclerosis .MR (mitral regurgitation) .TR (tricuspid regurgitation) .Diabetes mellitus type II, controlled .Seizures .TIA (transient ischemic attack) .Altered mental status .Cerebrovascular disease: Patient is a 77 year old white male with past medical history of CAD and cerebrovascular disease who presented with an episode similar to several months ago where he had a brief period of altered mental status,  unresponsiveness and arm jerking movements) for seizure versus contracture. He was admitted for TIA rule out. MRI, MRA and CT were done as well carotid Dopplers. The workup for stroke was negative. Was in his pressures were stable. The only concerning factor was his MRA noted left MCA near complete occlusion. This is unchanged from his previous studies done in July. No other causes were found including no signs of any infection, dehydration, electrolyte abnormalities. He was noted to have some incidental slightly lower blood sugars with a CBG of 79 on site by paramedics. It's possible that he had some hypoglycemia which can contribute to this event. I had an extensive discussion with the patient about how to proceed from here. As of several hours after he was admitted he is completely back to his normal baseline of alert and oriented x3 with no focal neurological deficits. I told him that I am unclear as to what caused his symptoms. Also told him that he has this large MCA near occlusion which could lead to future stroke and may or less likely may not have contributed to his altered mental status episodes. We agreed after discussion with the patient, his wife and his daughter we will discharge the patient to home. We will make no medication changes at this time. We'll have him follow up with Dr. Anne Hahn his neurologist.  Dr. Anne Hahn then can refer the patient to vascular surgery versus interventional radiology for evaluation of possible MCA stent. This is certainly not without risks even for catheter angiogram for further assessment. It is the patient's best interest to receive information and  decide how to proceed. The family and patient are amenable to this plan.   Day of Discharge BP 131/64  Pulse 67  Temp(Src) 98.2 F (36.8 C) (Oral)  Resp 17  SpO2 98%  Physical Exam: Gen.: Alert and oriented x3, no apparent distress HEENT: Normocephalic, atraumatic, mucous members are moist, cranial nerves II through  XII are intact. Cardiovascular: Regular rate and rhythm S1-S2, soft 2/6 systolic ejection murmur Lungs auscultation bilaterally Abdomen: Soft, nontender, nondistended, positive bowel sounds ; No clubbing cyanosis, trace pitting edema, 1+ pulses Neuro: No focal neurological deficits. Flexion, extension and grip are symmetric and approximately 5 out of 5 upper and lower extremity.  Results for orders placed during the hospital encounter of 03/22/11 (from the past 24 hour(s))  CBC     Status: Abnormal   Collection Time   03/22/11  7:37 PM      Component Value Range   WBC 11.3 (*) 4.0 - 10.5 (K/uL)   RBC 3.76 (*) 4.22 - 5.81 (MIL/uL)   Hemoglobin 12.0 (*) 13.0 - 17.0 (g/dL)   HCT 16.1 (*) 09.6 - 52.0 (%)   MCV 90.4  78.0 - 100.0 (fL)   MCH 31.9  26.0 - 34.0 (pg)   MCHC 35.3  30.0 - 36.0 (g/dL)   RDW 04.5  40.9 - 81.1 (%)   Platelets 252  150 - 400 (K/uL)  DIFFERENTIAL     Status: Abnormal   Collection Time   03/22/11  7:37 PM      Component Value Range   Neutrophils Relative 83 (*) 43 - 77 (%)   Neutro Abs 9.4 (*) 1.7 - 7.7 (K/uL)   Lymphocytes Relative 9 (*) 12 - 46 (%)   Lymphs Abs 1.0  0.7 - 4.0 (K/uL)   Monocytes Relative 5  3 - 12 (%)   Monocytes Absolute 0.6  0.1 - 1.0 (K/uL)   Eosinophils Relative 2  0 - 5 (%)   Eosinophils Absolute 0.2  0.0 - 0.7 (K/uL)   Basophils Relative 0  0 - 1 (%)   Basophils Absolute 0.0  0.0 - 0.1 (K/uL)  COMPREHENSIVE METABOLIC PANEL     Status: Abnormal   Collection Time   03/22/11  7:37 PM      Component Value Range   Sodium 132 (*) 135 - 145 (mEq/L)   Potassium 5.0  3.5 - 5.1 (mEq/L)   Chloride 93 (*) 96 - 112 (mEq/L)   CO2 27  19 - 32 (mEq/L)   Glucose, Bld 175 (*) 70 - 99 (mg/dL)   BUN 24 (*) 6 - 23 (mg/dL)   Creatinine, Ser 9.14  0.50 - 1.35 (mg/dL)   Calcium 78.2  8.4 - 10.5 (mg/dL)   Total Protein 7.0  6.0 - 8.3 (g/dL)   Albumin 4.3  3.5 - 5.2 (g/dL)   AST 20  0 - 37 (U/L)   ALT 18  0 - 53 (U/L)   Alkaline Phosphatase 51  39 - 117  (U/L)   Total Bilirubin 0.2 (*) 0.3 - 1.2 (mg/dL)   GFR calc non Af Amer 55 (*) >90 (mL/min)   GFR calc Af Amer 63 (*) >90 (mL/min)  GLUCOSE, CAPILLARY     Status: Abnormal   Collection Time   03/22/11  7:45 PM      Component Value Range   Glucose-Capillary 166 (*) 70 - 99 (mg/dL)  GLUCOSE, CAPILLARY     Status: Abnormal   Collection Time   03/22/11  9:34 PM  Component Value Range   Glucose-Capillary 137 (*) 70 - 99 (mg/dL)  TROPONIN I     Status: Normal   Collection Time   03/22/11  9:50 PM      Component Value Range   Troponin I <0.30  <0.30 (ng/mL)  CBC     Status: Abnormal   Collection Time   03/22/11 10:07 PM      Component Value Range   WBC 9.9  4.0 - 10.5 (K/uL)   RBC 3.80 (*) 4.22 - 5.81 (MIL/uL)   Hemoglobin 11.8 (*) 13.0 - 17.0 (g/dL)   HCT 04.5 (*) 40.9 - 52.0 (%)   MCV 90.0  78.0 - 100.0 (fL)   MCH 31.1  26.0 - 34.0 (pg)   MCHC 34.5  30.0 - 36.0 (g/dL)   RDW 81.1  91.4 - 78.2 (%)   Platelets 266  150 - 400 (K/uL)  GLUCOSE, CAPILLARY     Status: Abnormal   Collection Time   03/22/11 11:28 PM      Component Value Range   Glucose-Capillary 112 (*) 70 - 99 (mg/dL)   Comment 1 Documented in Chart     Comment 2 Notify RN    CREATININE, SERUM     Status: Abnormal   Collection Time   03/23/11  5:00 AM      Component Value Range   Creatinine, Ser 1.16  0.50 - 1.35 (mg/dL)   GFR calc non Af Amer 58 (*) >90 (mL/min)   GFR calc Af Amer 67 (*) >90 (mL/min)  TROPONIN I     Status: Normal   Collection Time   03/23/11  6:00 AM      Component Value Range   Troponin I <0.30  <0.30 (ng/mL)  GLUCOSE, CAPILLARY     Status: Normal   Collection Time   03/23/11  6:56 AM      Component Value Range   Glucose-Capillary 94  70 - 99 (mg/dL)  URINALYSIS, ROUTINE W REFLEX MICROSCOPIC     Status: Abnormal   Collection Time   03/23/11  7:00 AM      Component Value Range   Color, Urine YELLOW  YELLOW    Appearance CLEAR  CLEAR    Specific Gravity, Urine 1.006  1.005 - 1.030    pH  7.0  5.0 - 8.0    Glucose, UA NEGATIVE  NEGATIVE (mg/dL)   Hgb urine dipstick TRACE (*) NEGATIVE    Bilirubin Urine NEGATIVE  NEGATIVE    Ketones, ur NEGATIVE  NEGATIVE (mg/dL)   Protein, ur NEGATIVE  NEGATIVE (mg/dL)   Urobilinogen, UA 0.2  0.0 - 1.0 (mg/dL)   Nitrite NEGATIVE  NEGATIVE    Leukocytes, UA NEGATIVE  NEGATIVE   URINE MICROSCOPIC-ADD ON     Status: Normal   Collection Time   03/23/11  7:00 AM      Component Value Range   WBC, UA 0-2  <3 (WBC/hpf)   RBC / HPF 0-2  <3 (RBC/hpf)  GLUCOSE, CAPILLARY     Status: Abnormal   Collection Time   03/23/11 12:06 PM      Component Value Range   Glucose-Capillary 171 (*) 70 - 99 (mg/dL)  GLUCOSE, CAPILLARY     Status: Abnormal   Collection Time   03/23/11  4:20 PM      Component Value Range   Glucose-Capillary 148 (*) 70 - 99 (mg/dL)    Disposition: Improved   Follow-up Appts: Discharge Orders    Future Orders Please Complete  By Expires   Diet - low sodium heart healthy      Increase activity slowly         Follow-up with Dr. Anne Hahn, neurology in one to 2 weeks.  Dr. Clarene Duke, PCP in 3 weeks  Tests Needing Follow-up: Final EEG report to be reviewed by neurology.  SignedHollice Espy 03/23/2011, 5:17 PM

## 2011-03-23 NOTE — ED Provider Notes (Signed)
Medical screening examination/treatment/procedure(s) were conducted as a shared visit with non-physician practitioner(s) and myself.  I personally evaluated the patient during the encounter  Presents after a seizure episode. He also had an onset of slurred speech and clumsiness prior to the seizure. All these symptoms have resolved. Hasn't worked up by neurology for seizures.  Normal neurologic exam. The remainder of his exam is unremarkable. Heart regular rate and rhythm. Lungs clear  Dayton Bailiff, MD 03/23/11 [phone]

## 2011-03-23 NOTE — Progress Notes (Signed)
Utilization Review Completed.  Joshua York T  03/23/2011 

## 2011-03-23 NOTE — Progress Notes (Signed)
Please see shadow chart for Care Mgt documentation . Spoke w pt and wife, getting around room,does not anticipate any dc needs.

## 2011-03-23 NOTE — Procedures (Signed)
EEG COMPLETED IN DEPT. PT TOLERATED WELL.

## 2011-03-24 NOTE — Procedures (Signed)
EEG NUMBER:  REFERRING PHYSICIAN:  Eddie North, MD  HISTORY:  An 77 year old male with episode of blurry vision and slurred speech.  MEDICATIONS:  Lovenox, Glucotrol, Xalatan, Zocor, aspirin, Norvasc, Glucophage, Actos, Mavik, and Benicar.  CONDITIONS OF RECORDING:  This is a 16-channel EEG carried out with the patient in the awake state.  DESCRIPTION:  The posterior background activity consists of a low- voltage, symmetrical, fairly well-organized.  8.5 Hz alpha activity seen from the parieto-occipital and posterotemporal regions.  Low-voltage, fast activity, poorly organized was seen anteriorly at times, superimposed on more posterior rhythms.  A mixture of theta and alpha rhythm was seen from the central and temporal regions.  The patient does not drowse or sleep.  Hypoventilation was performed and produced a mild buildup, but failed to elicit any abnormalities.  Intermittent photic stimulation was performed but failed to elicit any change in the tracing.  IMPRESSION:  This is a normal awake EEG.  No epileptiform activity was noted.  COMMENT:  An EEG with the patient sleep deprived to elicit drowse and light sleep, may be desirable to further elicit possible seizure disorder.          ______________________________ Thana Farr, MD    [id number] D:  03/23/2011 18:06:28  T:  03/23/2011 20:23:01  Job #:  [id number]

## 2011-04-07 ENCOUNTER — Other Ambulatory Visit: Payer: Self-pay

## 2011-04-07 DIAGNOSIS — G459 Transient cerebral ischemic attack, unspecified: Secondary | ICD-10-CM

## 2011-04-13 ENCOUNTER — Encounter: Payer: Self-pay | Admitting: Surgery

## 2011-04-24 ENCOUNTER — Encounter: Payer: Self-pay | Admitting: Surgery

## 2011-04-27 ENCOUNTER — Ambulatory Visit (INDEPENDENT_AMBULATORY_CARE_PROVIDER_SITE_OTHER): Payer: Medicare Other | Admitting: Surgery

## 2011-04-27 ENCOUNTER — Encounter: Payer: Self-pay | Admitting: Surgery

## 2011-04-27 ENCOUNTER — Other Ambulatory Visit (INDEPENDENT_AMBULATORY_CARE_PROVIDER_SITE_OTHER): Payer: Medicare Other | Admitting: *Deleted

## 2011-04-27 VITALS — BP 130/72 | HR 71 | Resp 16 | Ht 68.0 in | Wt 154.0 lb

## 2011-04-27 DIAGNOSIS — Z8673 Personal history of transient ischemic attack (TIA), and cerebral infarction without residual deficits: Secondary | ICD-10-CM

## 2011-04-27 DIAGNOSIS — I6529 Occlusion and stenosis of unspecified carotid artery: Secondary | ICD-10-CM

## 2011-04-27 DIAGNOSIS — Z8679 Personal history of other diseases of the circulatory system: Secondary | ICD-10-CM

## 2011-04-27 NOTE — Progress Notes (Signed)
[facility]   Patient name: Joshua York MRN: [id number] DOB: [birth year] Sex: male   Referred by: Catha Gosselin  Reason for referral:  Chief Complaint  Patient presents with  . Carotid    history of TIA 03-22-11  REF-->> Dr. Catha Gosselin,   Had duplex today    HISTORY OF PRESENT ILLNESS: The patient comes in today for evaluation of his cerebrovascular disease. The patient has had 2 neurologic events one in June and most recently in early November. This November he presented to to the emergency department where he had an episode of confusion self urination and inability to pulled the stripper down on his pants. It lasted several minutes. A full workup in the hospital was done an MRA revealed left MCA (M1) occlusion. Carotid ultrasounds of bilateral atherosclerosis left greater than right but less than 50%. The patient was started on aspirin and Plavix and discharged to home. He did see the neurology stroke service while he was in the hospital. He has had no new symptoms.  The patient does suffer from diabetes which is relatively well controlled. He is meticulous about his diet. He is also medically managed for his hyperlipidemia and essential hypertension.  Past Medical History  Diagnosis Date  . Diabetes mellitus   . Hyperlipidemia   . Hypertension   . Chest pain   . Diastolic dysfunction   . Mild aortic sclerosis   . MR (mitral regurgitation)     MILD  . TR (tricuspid regurgitation)     MILD  . Glaucoma   . Hypercholesteremia   . Arthritis   . Depression   . Peripheral vascular disease   . Stroke     History of TIA  . Cancer     skin cancer    Past Surgical History  Procedure Date  . Fracture surgery 1947    broken cheekbone  . Basal carcinoma reoved 6times within past 10years 1995 to 2005    6 basal cell carcinomas removed  . Hernia repair 1980    History   Social History  . Marital Status: Married    Spouse Name: N/A    Number  of Children: N/A  . Years of Education: N/A   Occupational History  . Not on file.   Social History Main Topics  . Smoking status: Former Smoker    Types: Cigarettes    Quit date: 08/27/1985  . Smokeless tobacco: Not on file  . Alcohol Use: 3.6 oz/week    6 Shots of liquor per week  . Drug Use: No  . Sexually Active: Yes   Other Topics Concern  . Not on file   Social History Narrative  . No narrative on file    Family History  Problem Relation Age of Onset  . Heart attack Father 38  . Hypertension Father   . Heart disease Father   . Other Mother 63    OLD AGE  . Diabetes Brother   . Heart disease Brother   . Hypertension Brother   . Heart attack Brother     Allergies as of 04/27/2011 - Review Complete 04/27/2011  Allergen Reaction Noted  . Iodinated diagnostic agents  08/28/2010  . Novocain  04/13/2011  . Shellfish-derived products  08/28/2010    Current Outpatient Prescriptions on File Prior to Visit  Medication Sig Dispense Refill  . amLODipine (NORVASC) 10 MG tablet Take 10 mg by mouth every morning.       Marland Kitchen glipiZIDE (  GLUCOTROL) 10 MG tablet Take 10 mg by mouth every morning.       . latanoprost (XALATAN) 0.005 % ophthalmic solution Place 1 drop into both eyes at bedtime.       . lovastatin (MEVACOR) 20 MG tablet Take 20 mg by mouth every evening.       . metFORMIN (GLUCOPHAGE) 500 MG tablet Take 1,000 mg by mouth 2 (two) times daily.       Marland Kitchen olmesartan (BENICAR) 20 MG tablet Take 20 mg by mouth every morning.       . pioglitazone (ACTOS) 30 MG tablet Take 30 mg by mouth every morning.       . trandolapril (MAVIK) 4 MG tablet Take 4 mg by mouth every morning.       . hydrochlorothiazide 25 MG tablet Take 25 mg by mouth every morning.          REVIEW OF SYSTEMS: Cardiovascular: No chest pain, chest pressure, palpitations, orthopnea, or dyspnea on exertion. No claudication or rest pain,  No history of DVT or phlebitis. Pulmonary: No productive cough,  asthma or wheezing. Neurologic: No weakness, paresthesias, aphasia, or amaurosis. No dizziness. Hematologic: No bleeding problems or clotting disorders. Musculoskeletal: No joint pain or joint swelling. Gastrointestinal: No blood in stool or hematemesis Genitourinary: No dysuria or hematuria. Psychiatric:: No history of major depression. Integumentary: No rashes or ulcers. Constitutional: No fever or chills.  PHYSICAL EXAMINATION: General: The patient appears their stated age.  Vital signs are BP 130/72  Pulse 71  Resp 16  Ht 5\' 8"  (1.727 m)  Wt 154 lb (69.854 kg)  BMI 23.42 kg/m2  SpO2 98% HEENT:  No gross abnormalities Pulmonary: Respirations are non-labored Abdomen: Soft and non-tender  Musculoskeletal: There are no major deformities.   Neurologic: No focal weakness or paresthesias are detected, Skin: There are no ulcer or rashes noted. Psychiatric: The patient has normal affect. Cardiovascular: There is a regular rate and rhythm without significant murmur appreciated. No carotid bruits  Diagnostic Studies: Carotid ultrasound was ordered and reviewed today this is one of 39% stenosis bilaterally with antegrade vertebral artery flow    Assessment:  Left middle cerebral artery(M1) occlusion Plan: The patient does not have significant extracranial carotid occlusive disease. I do not think this contributed to his stroke. After reviewing his MRI I feel that most likely the artery is occluded and that he would not benefit from intracranial intervention. I think he would be best managed medically which would include antiplatelet therapy (aspirin and Plavix), blood glucose control, cluster all management and blood pressure control. I cannot see where he has had an echo. If this has not been performed I would obtain this to make sure that there is no source for emboli. I met have the patient come back in 2 years for a followup carotid duplex     V. Charlena Cross, M.D. [facility] [place] Office: [phone] Pager:  [phone]

## 2011-04-29 ENCOUNTER — Ambulatory Visit (INDEPENDENT_AMBULATORY_CARE_PROVIDER_SITE_OTHER): Payer: Medicare Other | Admitting: Surgery

## 2011-04-29 ENCOUNTER — Encounter (INDEPENDENT_AMBULATORY_CARE_PROVIDER_SITE_OTHER): Payer: Self-pay | Admitting: Surgery

## 2011-04-29 DIAGNOSIS — K4090 Unilateral inguinal hernia, without obstruction or gangrene, not specified as recurrent: Secondary | ICD-10-CM | POA: Insufficient documentation

## 2011-04-29 NOTE — Progress Notes (Signed)
Patient ID: Joshua York, male   DOB: 1928-09-19, 77 y.o.   MRN: [id number]  Chief Complaint  Patient presents with  . Other    new pt- eval [facility]    HPI Joshua York is a 77 y.o. male.  The patient presents today at the request of Dr. little due to a bulge in his left groin. It is causing pain. The pain is made worse with exertion. He is sharp in nature without radiation. It is made better with rest. He has a history of a right inguinal hernia repair in 1980. He denies any nausea or vomiting.HPI  Past Medical History  Diagnosis Date  . Diabetes mellitus   . Hyperlipidemia   . Hypertension   . Chest pain   . Diastolic dysfunction   . Mild aortic sclerosis   . MR (mitral regurgitation)     MILD  . TR (tricuspid regurgitation)     MILD  . Glaucoma   . Hypercholesteremia   . Depression   . Peripheral vascular disease   . Stroke     History of TIA  . Cancer     skin cancer  . Inguinal hernia     [facility]    Past Surgical History  Procedure Date  . Fracture surgery 1947    broken cheekbone  . Basal carcinoma reoved 6times within past 10years 1995 to 2005    6 basal cell carcinomas removed  . Hernia repair 1980    [facility]    Family History  Problem Relation Age of Onset  . Heart attack Father 37  . Hypertension Father   . Heart disease Father   . Other Mother 40    OLD AGE  . Diabetes Brother   . Heart disease Brother   . Hypertension Brother   . Heart attack Brother   . Cancer Brother     lung    Social History History  Substance Use Topics  . Smoking status: Former Smoker    Types: Cigarettes    Quit date: 08/27/1985  . Smokeless tobacco: Not on file  . Alcohol Use: 3.6 oz/week    6 Shots of liquor per week    Allergies  Allergen Reactions  . Iodinated Diagnostic Agents   . Novocain   . Shellfish-Derived Products     Current Outpatient Prescriptions  Medication Sig Dispense Refill  . amLODipine (NORVASC) 10 MG tablet Take 10 mg by mouth every  morning.       Marland Kitchen aspirin 81 MG tablet Take 81 mg by mouth daily.        . clopidogrel (PLAVIX) 75 MG tablet Take 75 mg by mouth daily.        Marland Kitchen glipiZIDE (GLUCOTROL) 10 MG tablet Take 10 mg by mouth every morning.       . hydrochlorothiazide 25 MG tablet Take 25 mg by mouth every morning.       . latanoprost (XALATAN) 0.005 % ophthalmic solution Place 1 drop into both eyes at bedtime.       . lovastatin (MEVACOR) 20 MG tablet Take 20 mg by mouth every evening.       . metFORMIN (GLUCOPHAGE) 500 MG tablet Take 1,000 mg by mouth 2 (two) times daily.       Marland Kitchen olmesartan (BENICAR) 20 MG tablet Take 20 mg by mouth every morning.       . pioglitazone (ACTOS) 30 MG tablet Take 30 mg by mouth every morning.       Marland Kitchen  trandolapril (MAVIK) 4 MG tablet Take 4 mg by mouth every morning.         Review of Systems Review of Systems  Constitutional: Negative.   HENT: Negative.   Eyes: Negative.   Respiratory: Negative.   Cardiovascular: Negative.   Gastrointestinal: Negative.   Genitourinary: Negative.   Musculoskeletal: Negative.   Neurological: Positive for dizziness, syncope and numbness.  Hematological: Negative.   Psychiatric/Behavioral: Negative.     Blood pressure 138/68, pulse 72, temperature 98.1 F (36.7 C), temperature source Temporal, resp. rate 16, height 5\' 8"  (1.727 m), weight 156 lb (70.761 kg).  Physical Exam Physical Exam  Constitutional: He appears well-developed and well-nourished.  HENT:  Head: Normocephalic and atraumatic.  Eyes: EOM are normal. Pupils are equal, round, and reactive to light.  Neck: Normal range of motion. Neck supple.  Cardiovascular: Normal rate and regular rhythm.   Pulmonary/Chest: Effort normal and breath sounds normal.  Abdominal: Soft. Bowel sounds are normal.  Genitourinary:       Reducible left inguinal hernia    Data Reviewed   Assessment    Left inguinal hernia    Plan    The patient would like to have his left inguinal hernia  repaired he will return next month for a preop visit.      Kolter Reaver A. 04/29/2011, 4:47 PM

## 2011-04-29 NOTE — Patient Instructions (Signed)

## 2011-05-04 NOTE — Procedures (Unsigned)
CAROTID DUPLEX EXAM  INDICATION:  Middle cerebral artery blockage.  HISTORY: Diabetes:  Yes. Cardiac:  No. Hypertension:  Yes. Smoking:  Quit in 1980. Previous Surgery:  No. CV History:  TIA, March 22, 2011.  Currently asymptomatic. Amaurosis Fugax No, Paresthesias No, Hemiparesis No. Other:  Hyperlipidemia.                                      RIGHT             LEFT Brachial systolic pressure:         141               138 Brachial Doppler waveforms:         Normal            Normal Vertebral direction of flow:        Antegrade         Antegrade DUPLEX VELOCITIES (cm/sec) CCA peak systolic                   81                106 ECA peak systolic                   104               72 ICA peak systolic                   85                75 ICA end diastolic                   27                23 PLAQUE MORPHOLOGY:                  Mixed             Mixed PLAQUE AMOUNT:                      Minimal           Minimal PLAQUE LOCATION:                    CCA, ICA, ECA     CCA, ICA, ECA  IMPRESSION: 1. Bilateral internal carotid artery velocities suggest a 1% to 39%     stenosis. 2. Scattered calcific plaque noted throughout the right and left     carotid systems. 3. Antegrade vertebral arteries bilaterally.  ___________________________________________ V. Charlena Cross, MD  EM/MEDQ  D:  04/27/2011  T:  04/27/2011  Job:  [id number]

## 2011-05-26 DIAGNOSIS — L821 Other seborrheic keratosis: Secondary | ICD-10-CM | POA: Diagnosis not present

## 2011-05-26 DIAGNOSIS — Z85828 Personal history of other malignant neoplasm of skin: Secondary | ICD-10-CM | POA: Diagnosis not present

## 2011-05-26 DIAGNOSIS — L570 Actinic keratosis: Secondary | ICD-10-CM | POA: Diagnosis not present

## 2011-05-27 DIAGNOSIS — R55 Syncope and collapse: Secondary | ICD-10-CM | POA: Diagnosis not present

## 2011-06-01 ENCOUNTER — Ambulatory Visit (INDEPENDENT_AMBULATORY_CARE_PROVIDER_SITE_OTHER): Payer: Medicare Other | Admitting: Surgery

## 2011-06-01 ENCOUNTER — Encounter (INDEPENDENT_AMBULATORY_CARE_PROVIDER_SITE_OTHER): Payer: Self-pay | Admitting: Surgery

## 2011-06-01 VITALS — BP 130/72 | HR 80 | Temp 97.2°F | Resp 16 | Ht 68.0 in | Wt 158.2 lb

## 2011-06-01 DIAGNOSIS — K4090 Unilateral inguinal hernia, without obstruction or gangrene, not specified as recurrent: Secondary | ICD-10-CM

## 2011-06-01 NOTE — Progress Notes (Signed)
Patient ID: Joshua York, male   DOB: 10/18/1928, 78 y.o.   MRN: [id number]  Chief Complaint  Patient presents with  . Routine Post Op    PO hernia    HPI Joshua York is a 78 y.o. male.  The patient presents today at the request of Dr. little due to a bulge in his left groin. It is causing pain. The pain is made worse with exertion. He is sharp in nature without radiation. It is made better with rest. He has a history of a right inguinal hernia repair in 1980. He denies any nausea or vomiting.HPI  Past Medical History  Diagnosis Date  . Diabetes mellitus   . Hyperlipidemia   . Hypertension   . Chest pain   . Diastolic dysfunction   . Mild aortic sclerosis   . MR (mitral regurgitation)     MILD  . TR (tricuspid regurgitation)     MILD  . Glaucoma   . Hypercholesteremia   . Depression   . Peripheral vascular disease   . Stroke     History of TIA  . Cancer     skin cancer  . Inguinal hernia     LIH    Past Surgical History  Procedure Date  . Fracture surgery 1947    broken cheekbone  . Basal carcinoma reoved 6times within past 10years 1995 to 2005    6 basal cell carcinomas removed  . Hernia repair 1980    RIH    Family History  Problem Relation Age of Onset  . Heart attack Father 60  . Hypertension Father   . Heart disease Father   . Other Mother 90    OLD AGE  . Diabetes Brother   . Heart disease Brother   . Hypertension Brother   . Heart attack Brother   . Cancer Brother     lung    Social History History  Substance Use Topics  . Smoking status: Former Smoker    Types: Cigarettes    Quit date: 08/28/1978  . Smokeless tobacco: Not on file  . Alcohol Use: 3.6 oz/week    6 Shots of liquor per week    Allergies  Allergen Reactions  . Hctz (Hydrochlorothiazide)     Depletes sodium  . Iodinated Diagnostic Agents   . Novocain   . Shellfish-Derived Products     Current Outpatient Prescriptions  Medication Sig Dispense Refill  . amLODipine  (NORVASC) 10 MG tablet Take 10 mg by mouth every morning.       . aspirin 81 MG tablet Take 81 mg by mouth daily.        . clopidogrel (PLAVIX) 75 MG tablet Take 75 mg by mouth daily.        . glipiZIDE (GLUCOTROL) 10 MG tablet Take 10 mg by mouth every morning.       . latanoprost (XALATAN) 0.005 % ophthalmic solution Place 1 drop into both eyes at bedtime.       . lovastatin (MEVACOR) 20 MG tablet Take 20 mg by mouth every evening.       . metFORMIN (GLUCOPHAGE) 500 MG tablet Take 1,000 mg by mouth 2 (two) times daily.       . olmesartan (BENICAR) 20 MG tablet Take 20 mg by mouth every morning.       . pioglitazone (ACTOS) 30 MG tablet Take 30 mg by mouth every morning.       . trandolapril (MAVIK) 4 MG tablet Take   4 mg by mouth every morning.         Review of Systems Review of Systems  Constitutional: Negative.   HENT: Negative.   Eyes: Negative.   Respiratory: Negative.   Cardiovascular: Negative.   Gastrointestinal: Negative.   Genitourinary: Negative.   Musculoskeletal: Negative.   Neurological: Positive for dizziness, syncope and numbness.  Hematological: Negative.   Psychiatric/Behavioral: Negative.     Blood pressure 130/72, pulse 80, temperature 97.2 F (36.2 C), temperature source Temporal, resp. rate 16, height 5' 8" (1.727 m), weight 158 lb 3.2 oz (71.759 kg).  Physical Exam Physical Exam  Constitutional: He appears well-developed and well-nourished.  HENT:  Head: Normocephalic and atraumatic.  Eyes: EOM are normal. Pupils are equal, round, and reactive to light.  Neck: Normal range of motion. Neck supple.  Cardiovascular: Normal rate and regular rhythm.   Pulmonary/Chest: Effort normal and breath sounds normal.  Abdominal: Soft. Bowel sounds are normal.  Genitourinary:       Reducible left inguinal hernia    Data Reviewed   Assessment    Left inguinal hernia    Plan    .  repair LIH. The risk of hernia repair include bleeding,  Infection,    Recurrence of the hernia,  Mesh use, chronic pain,  Organ injury,  Bowel injury,  Bladder injury,   nerve injury with numbness around the incision,  Death,  and worsening of preexisting  medical problems.  The alternatives to surgery have been discussed as well..  Long term expectations of both operative and non operative treatments have been discussed.   The patient agrees to proceed.     Jon Lall A. 06/01/2011, 3:36 PM    

## 2011-06-01 NOTE — Patient Instructions (Signed)
Inguinal Hernia, Adult Muscles help keep everything in the body in its proper place. But if a weak spot in the muscles develops, something can poke through. That is called a hernia. When this happens in the lower part of the belly (abdomen), it is called an inguinal hernia. (It takes its name from a part of the body in this region called the inguinal canal.) A weak spot in the wall of muscles lets some fat or part of the small intestine bulge through. An inguinal hernia can develop at any age. Men get them more often than women. CAUSES  In adults, an inguinal hernia develops over time.  It can be triggered by:   Suddenly straining the muscles of the lower abdomen.   Lifting heavy objects.   Straining to have a bowel movement. Difficult bowel movements (constipation) can lead to this.   Constant coughing. This may be caused by smoking or lung disease.   Being overweight.   Being pregnant.   Working at a job that requires long periods of standing or heavy lifting.   Having had an inguinal hernia before.  One type can be an emergency situation. It is called a strangulated inguinal hernia. It develops if part of the small intestine slips through the weak spot and cannot get back into the abdomen. The blood supply can be cut off. If that happens, part of the intestine may die. This situation requires emergency surgery. SYMPTOMS  Often, a small inguinal hernia has no symptoms. It is found when a healthcare provider does a physical exam. Larger hernias usually have symptoms.   In adults, symptoms may include:   A lump in the groin. This is easier to see when the person is standing. It might disappear when lying down.   In men, a lump in the scrotum.   Pain or burning in the groin. This occurs especially when lifting, straining or coughing.   A dull ache or feeling of pressure in the groin.   Signs of a strangulated hernia can include:   A bulge in the groin that becomes very painful  and tender to the touch.   A bulge that turns red or purple.   Fever, nausea and vomiting.   Inability to have a bowel movement or to pass gas.  DIAGNOSIS  To decide if you have an inguinal hernia, a healthcare provider will probably do a physical examination.  This will include asking questions about any symptoms you have noticed.   The healthcare provider might feel the groin area and ask you to cough. If an inguinal hernia is felt, the healthcare provider may try to slide it back into the abdomen.   Usually no other tests are needed.  TREATMENT  Treatments can vary. The size of the hernia makes a difference. Options include:  Watchful waiting. This is often suggested if the hernia is small and you have had no symptoms.   No medical procedure will be done unless symptoms develop.   You will need to watch closely for symptoms. If any occur, contact your healthcare provider right away.   Surgery. This is used if the hernia is larger or you have symptoms.   Open surgery. This is usually an outpatient procedure (you will not stay overnight in a hospital). An cut (incision) is made through the skin in the groin. The hernia is put back inside the abdomen. The weak area in the muscles is then repaired by herniorrhaphy or hernioplasty. Herniorrhaphy: in this type of   surgery, the weak muscles are sewn back together. Hernioplasty: a patch or mesh is used to close the weak area in the abdominal wall.   Laparoscopy. In this procedure, a surgeon makes small incisions. A thin tube with a tiny video camera (called a laparoscope) is put into the abdomen. The surgeon repairs the hernia with mesh by looking with the video camera and using two long instruments.  HOME CARE INSTRUCTIONS   After surgery to repair an inguinal hernia:   You will need to take pain medicine prescribed by your healthcare provider. Follow all directions carefully.   You will need to take care of the wound from the incision.    Your activity will be restricted for awhile. This will probably include no heavy lifting for several weeks. You also should not do anything too active for a few weeks. When you can return to work will depend on the type of job that you have.   During "watchful waiting" periods, you should:   Maintain a healthy weight.   Eat a diet high in fiber (fruits, vegetables and whole grains).   Drink plenty of fluids to avoid constipation. This means drinking enough water and other liquids to keep your urine clear or pale yellow.   Do not lift heavy objects.   Do not stand for long periods of time.   Quit smoking. This should keep you from developing a frequent cough.  SEEK MEDICAL CARE IF:   A bulge develops in your groin area.   You feel pain, a burning sensation or pressure in the groin. This might be worse if you are lifting or straining.   You develop a fever of more than 100.5 F (38.1 C).  SEEK IMMEDIATE MEDICAL CARE IF:   Pain in the groin increases suddenly.   A bulge in the groin gets bigger suddenly and does not go down.   For men, there is sudden pain in the scrotum. Or, the size of the scrotum increases.   A bulge in the groin area becomes red or purple and is painful to touch.   You have nausea or vomiting that does not go away.   You feel your heart beating much faster than normal.   You cannot have a bowel movement or pass gas.   You develop a fever of more than 102.0 F (38.9 C).  Document Released: 09/20/2008 Document Revised: 01/14/2011 Document Reviewed: 09/20/2008 [employer] Patient Information 2012 [employer]  Inguinal Hernia, Adult  Care After Refer to this sheet in the next few weeks. These discharge instructions provide you with general information on caring for yourself after you leave the hospital. Your caregiver may also give you specific instructions. Your treatment has been planned according to the most current medical practices available, but  unavoidable complications sometimes occur. If you have any problems or questions after discharge, please call your caregiver. HOME CARE INSTRUCTIONS  Put ice on the operative site.   Put ice in a plastic bag.   Place a towel between your skin and the bag.   Leave the ice on for 15 to 20 minutes at a time, 3 to 4 times a day while awake.   Change bandages (dressings) as directed.   Keep the wound dry and clean. The wound may be washed gently with soap and water. Gently blot or dab the wound dry. It is okay to take showers 24 to 48 hours after surgery. Do not take baths, use swimming pools, or use hot tubs   for 10 days, or as directed by your caregiver.   Only take over-the-counter or prescription medicines for pain, discomfort, or fever as directed by your caregiver.   Continue your normal diet as directed.   Do not lift anything more than 10 pounds or play contact sports for 3 weeks, or as directed.  SEEK MEDICAL CARE IF:  There is redness, swelling, or increasing pain in the wound.   There is fluid (pus) coming from the wound.   There is drainage from a wound lasting longer than 1 day.   You have an oral temperature above 102 F (38.9 C).   You notice a bad smell coming from the wound or dressing.   The wound breaks open after the stitches (sutures) have been removed.   You notice increasing pain in the shoulders (shoulder strap areas).   You develop dizzy episodes or fainting while standing.   You feel sick to your stomach (nauseous) or throw up (vomit).  SEEK IMMEDIATE MEDICAL CARE IF:  You develop a rash.   You have difficulty breathing.   You develop a reaction or have side effects to medicines you were given.  MAKE SURE YOU:   Understand these instructions.   Will watch your condition.   Will get help right away if you are not doing well or get worse.  Document Released: 06/04/2006 Document Revised: 01/14/2011 Document Reviewed: 04/03/2009 [employer] Patient  Information 2012 [employer] 

## 2011-06-10 ENCOUNTER — Encounter (HOSPITAL_BASED_OUTPATIENT_CLINIC_OR_DEPARTMENT_OTHER): Payer: Self-pay | Admitting: *Deleted

## 2011-06-10 NOTE — Progress Notes (Signed)
To come in for labs cxr- 

## 2011-06-11 ENCOUNTER — Ambulatory Visit
Admission: RE | Admit: 2011-06-11 | Discharge: 2011-06-11 | Disposition: A | Discharge status: Home / Self Care | Payer: Medicare Other | Source: Ambulatory Visit | Attending: Surgery | Admitting: Surgery

## 2011-06-11 ENCOUNTER — Encounter (HOSPITAL_BASED_OUTPATIENT_CLINIC_OR_DEPARTMENT_OTHER)
Admission: RE | Admit: 2011-06-11 | Discharge: 2011-06-11 | Disposition: A | Discharge status: Home / Self Care | Payer: Medicare Other | Source: Ambulatory Visit | Attending: Surgery | Admitting: Surgery

## 2011-06-11 DIAGNOSIS — Z8673 Personal history of transient ischemic attack (TIA), and cerebral infarction without residual deficits: Secondary | ICD-10-CM | POA: Diagnosis not present

## 2011-06-11 DIAGNOSIS — F329 Major depressive disorder, single episode, unspecified: Secondary | ICD-10-CM | POA: Diagnosis not present

## 2011-06-11 DIAGNOSIS — R011 Cardiac murmur, unspecified: Secondary | ICD-10-CM | POA: Diagnosis not present

## 2011-06-11 DIAGNOSIS — E119 Type 2 diabetes mellitus without complications: Secondary | ICD-10-CM | POA: Diagnosis not present

## 2011-06-11 DIAGNOSIS — K4090 Unilateral inguinal hernia, without obstruction or gangrene, not specified as recurrent: Secondary | ICD-10-CM | POA: Diagnosis not present

## 2011-06-11 DIAGNOSIS — I10 Essential (primary) hypertension: Secondary | ICD-10-CM | POA: Diagnosis not present

## 2011-06-11 DIAGNOSIS — R0602 Shortness of breath: Secondary | ICD-10-CM | POA: Diagnosis not present

## 2011-06-11 DIAGNOSIS — Z01812 Encounter for preprocedural laboratory examination: Secondary | ICD-10-CM | POA: Diagnosis not present

## 2011-06-11 DIAGNOSIS — R9389 Abnormal findings on diagnostic imaging of other specified body structures: Secondary | ICD-10-CM | POA: Diagnosis not present

## 2011-06-11 DIAGNOSIS — Z01811 Encounter for preprocedural respiratory examination: Secondary | ICD-10-CM | POA: Diagnosis not present

## 2011-06-11 LAB — COMPREHENSIVE METABOLIC PANEL
Albumin: 3.9 g/dL (ref 3.5–5.2)
Alkaline Phosphatase: 41 U/L (ref 39–117)
BUN: 14 mg/dL (ref 6–23)
CO2: 27 mEq/L (ref 19–32)
Chloride: 101 mEq/L (ref 96–112)
Creatinine, Ser: 1.05 mg/dL (ref 0.50–1.35)
GFR calc non Af Amer: 65 mL/min — ABNORMAL LOW (ref >90)
Potassium: 4.4 mEq/L (ref 3.5–5.1)
Total Bilirubin: 0.3 mg/dL (ref 0.3–1.2)

## 2011-06-11 LAB — CBC
HCT: 34.5 % — ABNORMAL LOW (ref 39.0–52.0)
Hemoglobin: 11.3 g/dL — ABNORMAL LOW (ref 13.0–17.0)
MCV: 93.2 fL (ref 78.0–100.0)
RBC: 3.7 MIL/uL — ABNORMAL LOW (ref 4.22–5.81)
RDW: 14.9 % (ref 11.5–15.5)
WBC: 5.2 10*3/uL (ref 4.0–10.5)

## 2011-06-11 LAB — DIFFERENTIAL
Basophils Absolute: 0 10*3/uL (ref 0.0–0.1)
Basophils Relative: 0 % (ref 0–1)
Eosinophils Absolute: 0.2 10*3/uL (ref 0.0–0.7)
Monocytes Absolute: 0.3 10*3/uL (ref 0.1–1.0)
Monocytes Relative: 6 % (ref 3–12)
Neutrophils Relative %: 73 % (ref 43–77)

## 2011-06-16 ENCOUNTER — Ambulatory Visit (HOSPITAL_BASED_OUTPATIENT_CLINIC_OR_DEPARTMENT_OTHER): Payer: Medicare Other | Admitting: Anesthesiology

## 2011-06-16 ENCOUNTER — Encounter (HOSPITAL_BASED_OUTPATIENT_CLINIC_OR_DEPARTMENT_OTHER): Payer: Self-pay

## 2011-06-16 ENCOUNTER — Ambulatory Visit (HOSPITAL_BASED_OUTPATIENT_CLINIC_OR_DEPARTMENT_OTHER)
Admission: RE | Admit: 2011-06-16 | Discharge: 2011-06-16 | Disposition: A | Discharge status: Home / Self Care | Payer: Medicare Other | Source: Ambulatory Visit | Attending: Surgery | Admitting: Surgery

## 2011-06-16 ENCOUNTER — Encounter (HOSPITAL_BASED_OUTPATIENT_CLINIC_OR_DEPARTMENT_OTHER)
Admission: RE | Disposition: A | Discharge status: Home / Self Care | Payer: Self-pay | Source: Ambulatory Visit | Attending: Surgery

## 2011-06-16 ENCOUNTER — Encounter (HOSPITAL_BASED_OUTPATIENT_CLINIC_OR_DEPARTMENT_OTHER): Payer: Self-pay | Admitting: Anesthesiology

## 2011-06-16 DIAGNOSIS — Z8679 Personal history of other diseases of the circulatory system: Secondary | ICD-10-CM

## 2011-06-16 DIAGNOSIS — I350 Nonrheumatic aortic (valve) stenosis: Secondary | ICD-10-CM

## 2011-06-16 DIAGNOSIS — Z01812 Encounter for preprocedural laboratory examination: Secondary | ICD-10-CM | POA: Insufficient documentation

## 2011-06-16 DIAGNOSIS — R011 Cardiac murmur, unspecified: Secondary | ICD-10-CM | POA: Insufficient documentation

## 2011-06-16 DIAGNOSIS — F3289 Other specified depressive episodes: Secondary | ICD-10-CM | POA: Insufficient documentation

## 2011-06-16 DIAGNOSIS — I071 Rheumatic tricuspid insufficiency: Secondary | ICD-10-CM

## 2011-06-16 DIAGNOSIS — Z8673 Personal history of transient ischemic attack (TIA), and cerebral infarction without residual deficits: Secondary | ICD-10-CM | POA: Insufficient documentation

## 2011-06-16 DIAGNOSIS — R0602 Shortness of breath: Secondary | ICD-10-CM

## 2011-06-16 DIAGNOSIS — K4090 Unilateral inguinal hernia, without obstruction or gangrene, not specified as recurrent: Secondary | ICD-10-CM | POA: Insufficient documentation

## 2011-06-16 DIAGNOSIS — F329 Major depressive disorder, single episode, unspecified: Secondary | ICD-10-CM | POA: Diagnosis not present

## 2011-06-16 DIAGNOSIS — I5189 Other ill-defined heart diseases: Secondary | ICD-10-CM

## 2011-06-16 DIAGNOSIS — E119 Type 2 diabetes mellitus without complications: Secondary | ICD-10-CM | POA: Insufficient documentation

## 2011-06-16 DIAGNOSIS — I6529 Occlusion and stenosis of unspecified carotid artery: Secondary | ICD-10-CM

## 2011-06-16 DIAGNOSIS — I10 Essential (primary) hypertension: Secondary | ICD-10-CM | POA: Insufficient documentation

## 2011-06-16 DIAGNOSIS — E785 Hyperlipidemia, unspecified: Secondary | ICD-10-CM

## 2011-06-16 DIAGNOSIS — R4182 Altered mental status, unspecified: Secondary | ICD-10-CM

## 2011-06-16 DIAGNOSIS — I679 Cerebrovascular disease, unspecified: Secondary | ICD-10-CM

## 2011-06-16 DIAGNOSIS — I340 Nonrheumatic mitral (valve) insufficiency: Secondary | ICD-10-CM

## 2011-06-16 HISTORY — PX: INGUINAL HERNIA REPAIR: SHX194

## 2011-06-16 LAB — POCT HEMOGLOBIN-HEMACUE: Hemoglobin: 11 g/dL — ABNORMAL LOW (ref 13.0–17.0)

## 2011-06-16 LAB — GLUCOSE, CAPILLARY
Glucose-Capillary: 99 mg/dL (ref 70–99)
Glucose-Capillary: 98 mg/dL (ref 70–99)

## 2011-06-16 SURGERY — REPAIR, HERNIA, INGUINAL, ADULT
Anesthesia: General | Site: Groin | Laterality: Left | Wound class: Clean

## 2011-06-16 MED ORDER — PROPOFOL 10 MG/ML IV EMUL
INTRAVENOUS | Status: DC | PRN
  Administered 2011-06-16: 160 mg via INTRAVENOUS

## 2011-06-16 MED ORDER — LIDOCAINE HCL (CARDIAC) 20 MG/ML IV SOLN
INTRAVENOUS | Status: DC | PRN
  Administered 2011-06-16: 70 mg via INTRAVENOUS

## 2011-06-16 MED ORDER — CEFAZOLIN SODIUM 1-5 GM-% IV SOLN
1.0000 g | INTRAVENOUS | Status: AC
  Administered 2011-06-16: 1 g via INTRAVENOUS

## 2011-06-16 MED ORDER — ONDANSETRON HCL 4 MG/2ML IJ SOLN
INTRAMUSCULAR | Status: DC | PRN
  Administered 2011-06-16: 4 mg via INTRAVENOUS

## 2011-06-16 MED ORDER — BUPIVACAINE-EPINEPHRINE 0.25% -1:200000 IJ SOLN
INTRAMUSCULAR | Status: DC | PRN
  Administered 2011-06-16: 20 mL

## 2011-06-16 MED ORDER — HYDROMORPHONE HCL PF 1 MG/ML IJ SOLN: 0.2500 mg | INTRAMUSCULAR | Status: DC | PRN

## 2011-06-16 MED ORDER — FENTANYL CITRATE 0.05 MG/ML IJ SOLN
INTRAMUSCULAR | Status: DC | PRN
  Administered 2011-06-16 (×4): 25 ug via INTRAVENOUS

## 2011-06-16 MED ORDER — LACTATED RINGERS IV SOLN
INTRAVENOUS | Status: DC
  Administered 2011-06-16 (×2): via INTRAVENOUS

## 2011-06-16 MED ORDER — OXYCODONE-ACETAMINOPHEN 5-325 MG PO TABS: 1.0000 | ORAL_TABLET | ORAL | Status: AC | PRN

## 2011-06-16 MED ORDER — EPHEDRINE SULFATE 50 MG/ML IJ SOLN
INTRAMUSCULAR | Status: DC | PRN
  Administered 2011-06-16 (×2): 10 mg via INTRAVENOUS

## 2011-06-16 MED ORDER — ONDANSETRON HCL 4 MG/2ML IJ SOLN: 4.0000 mg | Freq: Four times a day (QID) | INTRAMUSCULAR | Status: DC | PRN

## 2011-06-16 SURGICAL SUPPLY — 54 items
ADH SKN CLS APL DERMABOND .7 (GAUZE/BANDAGES/DRESSINGS) ×1
BLADE SURG 10 STRL SS (BLADE) ×1 IMPLANT
BLADE SURG 15 STRL LF DISP TIS (BLADE) ×1 IMPLANT
BLADE SURG 15 STRL SS (BLADE) ×2
BLADE SURG ROTATE 9660 (MISCELLANEOUS) ×1 IMPLANT
CANISTER SUCTION 1200CC (MISCELLANEOUS) IMPLANT
CHLORAPREP W/TINT 26ML (MISCELLANEOUS) ×2 IMPLANT
CLOTH BEACON ORANGE TIMEOUT ST (SAFETY) ×2 IMPLANT
COVER MAYO STAND STRL (DRAPES) ×2 IMPLANT
COVER TABLE BACK 60X90 (DRAPES) ×2 IMPLANT
DECANTER SPIKE VIAL GLASS SM (MISCELLANEOUS) ×1 IMPLANT
DERMABOND ADVANCED (GAUZE/BANDAGES/DRESSINGS) ×1
DERMABOND ADVANCED .7 DNX12 (GAUZE/BANDAGES/DRESSINGS) ×1 IMPLANT
DRAIN PENROSE 1/2X12 LTX STRL (WOUND CARE) ×2 IMPLANT
DRAPE LAPAROTOMY TRNSV 102X78 (DRAPE) ×2 IMPLANT
DRAPE UTILITY XL STRL (DRAPES) ×2 IMPLANT
ELECT COATED BLADE 2.86 ST (ELECTRODE) ×2 IMPLANT
ELECT REM PT RETURN 9FT ADLT (ELECTROSURGICAL) ×2
ELECTRODE REM PT RTRN 9FT ADLT (ELECTROSURGICAL) ×1 IMPLANT
GAUZE SPONGE 4X4 12PLY STRL LF (GAUZE/BANDAGES/DRESSINGS) IMPLANT
GAUZE SPONGE 4X4 16PLY XRAY LF (GAUZE/BANDAGES/DRESSINGS) IMPLANT
GLOVE BIO SURGEON STRL SZ7 (GLOVE) ×2 IMPLANT
GLOVE BIOGEL PI IND STRL 7.0 (GLOVE) IMPLANT
GLOVE BIOGEL PI IND STRL 8 (GLOVE) ×1 IMPLANT
GLOVE BIOGEL PI INDICATOR 7.0 (GLOVE) ×1
GLOVE BIOGEL PI INDICATOR 8 (GLOVE) ×1
GLOVE ECLIPSE 8.0 STRL XLNG CF (GLOVE) ×2 IMPLANT
GOWN PREVENTION PLUS XLARGE (GOWN DISPOSABLE) ×4 IMPLANT
MESH HERNIA SYS ULTRAPRO LRG (Mesh General) ×1 IMPLANT
NDL HYPO 25X1 1.5 SAFETY (NEEDLE) ×1 IMPLANT
NEEDLE HYPO 25X1 1.5 SAFETY (NEEDLE) ×2 IMPLANT
NS IRRIG 1000ML POUR BTL (IV SOLUTION) ×1 IMPLANT
PACK BASIN DAY SURGERY FS (CUSTOM PROCEDURE TRAY) ×2 IMPLANT
PENCIL BUTTON HOLSTER BLD 10FT (ELECTRODE) ×2 IMPLANT
SLEEVE SCD COMPRESS KNEE MED (MISCELLANEOUS) ×2 IMPLANT
SPONGE LAP 4X18 X RAY DECT (DISPOSABLE) ×2 IMPLANT
STAPLER VISISTAT 35W (STAPLE) IMPLANT
SUT MON AB 4-0 PC3 18 (SUTURE) ×2 IMPLANT
SUT NOVA 0 T19/GS 22DT (SUTURE) ×4 IMPLANT
SUT VIC AB 0 SH 27 (SUTURE) ×2 IMPLANT
SUT VIC AB 2-0 SH 27 (SUTURE) ×2
SUT VIC AB 2-0 SH 27XBRD (SUTURE) ×1 IMPLANT
SUT VIC AB 3-0 54X BRD REEL (SUTURE) IMPLANT
SUT VIC AB 3-0 BRD 54 (SUTURE)
SUT VICRYL 3-0 CR8 SH (SUTURE) ×2 IMPLANT
SUT VICRYL AB 2 0 TIE (SUTURE) IMPLANT
SUT VICRYL AB 2 0 TIES (SUTURE)
SYR CONTROL 10ML LL (SYRINGE) ×2 IMPLANT
TAPE HYPAFIX 4 X10 (GAUZE/BANDAGES/DRESSINGS) IMPLANT
TOWEL OR 17X24 6PK STRL BLUE (TOWEL DISPOSABLE) ×4 IMPLANT
TOWEL OR NON WOVEN STRL DISP B (DISPOSABLE) ×2 IMPLANT
TUBE CONNECTING 20X1/4 (TUBING) IMPLANT
WATER STERILE IRR 1000ML POUR (IV SOLUTION) ×1 IMPLANT
YANKAUER SUCT BULB TIP NO VENT (SUCTIONS) IMPLANT

## 2011-06-16 NOTE — H&P (View-Only) (Signed)
Patient ID: Joshua York, male   DOB: [birth year], 78 y.o.   MRN: [id number]  Chief Complaint  Patient presents with  . Routine Post Op    PO hernia    HPI Joshua York is a 78 y.o. male.  The patient presents today at the request of Dr. little due to a bulge in his left groin. It is causing pain. The pain is made worse with exertion. He is sharp in nature without radiation. It is made better with rest. He has a history of a right inguinal hernia repair in 1980. He denies any nausea or vomiting.HPI  Past Medical History  Diagnosis Date  . Diabetes mellitus   . Hyperlipidemia   . Hypertension   . Chest pain   . Diastolic dysfunction   . Mild aortic sclerosis   . MR (mitral regurgitation)     MILD  . TR (tricuspid regurgitation)     MILD  . Glaucoma   . Hypercholesteremia   . Depression   . Peripheral vascular disease   . Stroke     History of TIA  . Cancer     skin cancer  . Inguinal hernia     LIH    Past Surgical History  Procedure Date  . Fracture surgery 1947    broken cheekbone  . Basal carcinoma reoved 6times within past 10years 1995 to 2005    6 basal cell carcinomas removed  . Hernia repair 1980    RIH    Family History  Problem Relation Age of Onset  . Heart attack Father 72  . Hypertension Father   . Heart disease Father   . Other Mother 4    OLD AGE  . Diabetes Brother   . Heart disease Brother   . Hypertension Brother   . Heart attack Brother   . Cancer Brother     lung    Social History History  Substance Use Topics  . Smoking status: Former Smoker    Types: Cigarettes    Quit date: 08/28/1978  . Smokeless tobacco: Not on file  . Alcohol Use: 3.6 oz/week    6 Shots of liquor per week    Allergies  Allergen Reactions  . Hctz (Hydrochlorothiazide)     Depletes sodium  . Iodinated Diagnostic Agents   . Novocain   . Shellfish-Derived Products     Current Outpatient Prescriptions  Medication Sig Dispense Refill  . amLODipine  (NORVASC) 10 MG tablet Take 10 mg by mouth every morning.       Marland Kitchen aspirin 81 MG tablet Take 81 mg by mouth daily.        . clopidogrel (PLAVIX) 75 MG tablet Take 75 mg by mouth daily.        Marland Kitchen glipiZIDE (GLUCOTROL) 10 MG tablet Take 10 mg by mouth every morning.       . latanoprost (XALATAN) 0.005 % ophthalmic solution Place 1 drop into both eyes at bedtime.       . lovastatin (MEVACOR) 20 MG tablet Take 20 mg by mouth every evening.       . metFORMIN (GLUCOPHAGE) 500 MG tablet Take 1,000 mg by mouth 2 (two) times daily.       Marland Kitchen olmesartan (BENICAR) 20 MG tablet Take 20 mg by mouth every morning.       . pioglitazone (ACTOS) 30 MG tablet Take 30 mg by mouth every morning.       . trandolapril (MAVIK) 4 MG tablet Take  4 mg by mouth every morning.         Review of Systems Review of Systems  Constitutional: Negative.   HENT: Negative.   Eyes: Negative.   Respiratory: Negative.   Cardiovascular: Negative.   Gastrointestinal: Negative.   Genitourinary: Negative.   Musculoskeletal: Negative.   Neurological: Positive for dizziness, syncope and numbness.  Hematological: Negative.   Psychiatric/Behavioral: Negative.     Blood pressure 130/72, pulse 80, temperature 97.2 F (36.2 C), temperature source Temporal, resp. rate 16, height 5\' 8"  (1.727 m), weight 158 lb 3.2 oz (71.759 kg).  Physical Exam Physical Exam  Constitutional: He appears well-developed and well-nourished.  HENT:  Head: Normocephalic and atraumatic.  Eyes: EOM are normal. Pupils are equal, round, and reactive to light.  Neck: Normal range of motion. Neck supple.  Cardiovascular: Normal rate and regular rhythm.   Pulmonary/Chest: Effort normal and breath sounds normal.  Abdominal: Soft. Bowel sounds are normal.  Genitourinary:       Reducible left inguinal hernia    Data Reviewed   Assessment    Left inguinal hernia    Plan    .  repair LIH. The risk of hernia repair include bleeding,  Infection,    Recurrence of the hernia,  Mesh use, chronic pain,  Organ injury,  Bowel injury,  Bladder injury,   nerve injury with numbness around the incision,  Death,  and worsening of preexisting  medical problems.  The alternatives to surgery have been discussed as well..  Long term expectations of both operative and non operative treatments have been discussed.   The patient agrees to proceed.     Joshua York A. 06/01/2011, 3:36 PM

## 2011-06-16 NOTE — Op Note (Signed)
Hernia, Open, Procedure Note  Left inguinal  Indications: The patient presented with a history of a left, reducible hernia.    Pre-operative Diagnosis: left reducible inguinal hernia  Post-operative Diagnosis: same  Surgeon: Harriette Bouillon A.   Assistants: none  Anesthesia: General LMA anesthesia and Local anesthesia 0.25.% bupivacaine, with epinephrine  ASA Class: 3  Procedure Details  The patient was seen again in the Holding Room. The risks, benefits, complications, treatment options, and expected outcomes were discussed with the patient. The possibilities of reaction to medication, pulmonary aspiration, perforation of viscus, bleeding, recurrent infection, the need for additional procedures, and development of a complication requiring transfusion or further operation were discussed with the patient and/or family. There was concurrence with the proposed plan, and informed consent was obtained. The site of surgery was properly noted/marked. The patient was taken to the Operating Room, identified as Joshua York, and the procedure verified as hernia repair. A Time Out was held and the above information confirmed.  The patient was placed in the supine position and underwent induction of anesthesia, the lower abdomen and groin was prepped and draped in the standard fashion, and 0.25% Marcaine with epinephrine was used to anesthetize the skin over the mid-portion of the inguinal canal. A transverse incision was made. Dissection was carried through the soft tissue to expose the inguinal canal and inguinal ligament along its lower edge. The external oblique fascia was split along the course of its fibers, exposing the inguinal canal. The cord and nerve were looped using a Penrose drain and reflected out of the field. The defect was exposed and a piece of prolene hernia system ultrapro mesh was and placed into  the defect. Interupted 2-0 novafil suture was then used  to repair the defect, with the  suture being sewn from the pubic tubercle inferiorly and superiorly along the canal to a level just beyond the internal ring.  0 vicryl was used to secure the mesh to the pubic tubercle and internal oblique. The mesh was split to allow passage of the cord and nerve into the canal without entrapment. The ilioinguinal nerve was divided to prevent entrapment in the mesh.   The contents were then returned to canal and the external oblique fashion was then closed in a continuous fashion using 3-0 Vicryl suture. Scarpa's layer closed with 3 0 vicryl and 4 0 monocryl used to close the skin.  Dermabond used for dressing.  Instrument, sponge, and needle counts were correct prior to closure and at the conclusion of the case.  Findings: Hernia as above  Estimated Blood Loss: less than 50 mL         Drains: None         Total IV Fluids:         Specimens: none               Complications: None; patient tolerated the procedure well.         Disposition: PACU - hemodynamically stable.         Condition: stable

## 2011-06-16 NOTE — Anesthesia Postprocedure Evaluation (Signed)
  Anesthesia Post-op Note  Patient: Joshua York  Procedure(s) Performed:  HERNIA REPAIR INGUINAL ADULT - LEFT INGUINAL HERNIA REPAIR  Patient Location: PACU  Anesthesia Type: General  Level of Consciousness: awake, alert  and oriented  Airway and Oxygen Therapy: Patient Spontanous Breathing  Post-op Pain: none  Post-op Assessment: Post-op Vital signs reviewed, Patient's Cardiovascular Status Stable, Respiratory Function Stable, Patent Airway, No signs of Nausea or vomiting and Pain level controlled  Post-op Vital Signs: Reviewed and stable  Complications: No apparent anesthesia complications

## 2011-06-16 NOTE — Anesthesia Preprocedure Evaluation (Signed)
Anesthesia Evaluation  Patient identified by MRN, date of birth, ID band Patient awake    Reviewed: Allergy & Precautions, H&P , NPO status , Patient's Chart, lab work & pertinent test results  Airway Mallampati: II  Neck ROM: full    Dental   Pulmonary shortness of breath,          Cardiovascular hypertension, + Valvular Problems/Murmurs     Neuro/Psych PSYCHIATRIC DISORDERS Depression CVA    GI/Hepatic   Endo/Other  Diabetes mellitus-  Renal/GU      Musculoskeletal   Abdominal   Peds  Hematology   Anesthesia Other Findings   Reproductive/Obstetrics                           Anesthesia Physical Anesthesia Plan  ASA: III  Anesthesia Plan: General   Post-op Pain Management:    Induction: Intravenous  Airway Management Planned: Oral ETT  Additional Equipment:   Intra-op Plan:   Post-operative Plan: Extubation in OR  Informed Consent: I have reviewed the patients History and Physical, chart, labs and discussed the procedure including the risks, benefits and alternatives for the proposed anesthesia with the patient or authorized representative who has indicated his/her understanding and acceptance.     Plan Discussed with: CRNA and Surgeon  Anesthesia Plan Comments:         Anesthesia Quick Evaluation

## 2011-06-16 NOTE — Anesthesia Procedure Notes (Signed)
Procedure Name: LMA Insertion Date/Time: 06/16/2011 9:07 AM Performed by: Signa Kell Pre-anesthesia Checklist: Patient identified, Emergency Drugs available, Suction available and Patient being monitored Patient Re-evaluated:Patient Re-evaluated prior to inductionOxygen Delivery Method: Circle System Utilized Preoxygenation: Pre-oxygenation with 100% oxygen Intubation Type: IV induction Ventilation: Mask ventilation without difficulty LMA: LMA inserted LMA Size: 5.0 Number of attempts: 1 Airway Equipment and Method: bite block Placement Confirmation: positive ETCO2 Tube secured with: Tape Dental Injury: Teeth and Oropharynx as per pre-operative assessment

## 2011-06-16 NOTE — Interval H&P Note (Signed)
History and Physical Interval Note:  06/16/2011 8:37 AM  Joshua York  has presented today for surgery, with the diagnosis of LEFT INGUINAL HERNIA  The various methods of treatment have been discussed with the patient and family. After consideration of risks, benefits and other options for treatment, the patient has consented to  Procedure(s): HERNIA REPAIR INGUINAL ADULT as a surgical intervention .  The patients' history has been reviewed, patient examined, no change in status, stable for surgery.  I have reviewed the patients' chart and labs.  Questions were answered to the patient's satisfaction.     Shondale Quinley A.

## 2011-06-16 NOTE — Transfer of Care (Signed)
Immediate Anesthesia Transfer of Care Note  Patient: Joshua York  Procedure(s) Performed:  HERNIA REPAIR INGUINAL ADULT - LEFT INGUINAL HERNIA REPAIR  Patient Location: PACU  Anesthesia Type: General  Level of Consciousness: awake  Airway & Oxygen Therapy: Patient Spontanous Breathing and Patient connected to face mask oxygen  Post-op Assessment: Report given to PACU RN and Post -op Vital signs reviewed and stable  Post vital signs: Reviewed and stable  Complications: No apparent anesthesia complications

## 2011-06-17 ENCOUNTER — Encounter (HOSPITAL_BASED_OUTPATIENT_CLINIC_OR_DEPARTMENT_OTHER): Payer: Self-pay | Admitting: Surgery

## 2011-07-06 DIAGNOSIS — H4011X0 Primary open-angle glaucoma, stage unspecified: Secondary | ICD-10-CM | POA: Diagnosis not present

## 2011-07-09 ENCOUNTER — Encounter (INDEPENDENT_AMBULATORY_CARE_PROVIDER_SITE_OTHER): Payer: Self-pay | Admitting: Surgery

## 2011-07-09 ENCOUNTER — Ambulatory Visit (INDEPENDENT_AMBULATORY_CARE_PROVIDER_SITE_OTHER): Payer: Medicare Other | Admitting: Surgery

## 2011-07-09 VITALS — BP 144/80 | HR 68 | Temp 97.4°F | Resp 18 | Ht 68.0 in | Wt 157.2 lb

## 2011-07-09 DIAGNOSIS — Z9889 Other specified postprocedural states: Secondary | ICD-10-CM

## 2011-07-09 NOTE — Progress Notes (Signed)
Patient returns after repair of left inguinal hernia. He is doing well  Exam: Left inguinal incision healing well without signs of infection. Swelling minimal. No evidence of recurrent hernia  Impression status post repair of left inguinal  hernia with mesh  Plan: Return to clinic as needed resume selected in 2 weeks.

## 2011-07-09 NOTE — Patient Instructions (Signed)
Resume full activity in 2 weeks.  

## 2011-07-22 DIAGNOSIS — E871 Hypo-osmolality and hyponatremia: Secondary | ICD-10-CM | POA: Diagnosis not present

## 2011-07-22 DIAGNOSIS — E119 Type 2 diabetes mellitus without complications: Secondary | ICD-10-CM | POA: Diagnosis not present

## 2011-09-01 ENCOUNTER — Ambulatory Visit (INDEPENDENT_AMBULATORY_CARE_PROVIDER_SITE_OTHER): Payer: Medicare Other | Admitting: Cardiovascular Disease

## 2011-09-01 ENCOUNTER — Encounter: Payer: Self-pay | Admitting: Cardiovascular Disease

## 2011-09-01 VITALS — BP 130/80 | HR 65 | Ht 68.0 in | Wt 157.4 lb

## 2011-09-01 DIAGNOSIS — E785 Hyperlipidemia, unspecified: Secondary | ICD-10-CM

## 2011-09-01 DIAGNOSIS — I10 Essential (primary) hypertension: Secondary | ICD-10-CM | POA: Diagnosis not present

## 2011-09-01 NOTE — Assessment & Plan Note (Signed)
His blood pressure was mildly elevated when he walked in but then it settled down to a fairly normal range after had been sitting quietly for several  minutes. His blood pressure has been little elevated since stopping his HCTZ. The HCTZ was stopped because he was having problems with hyponatremia.  He continues to have problems with hypertension and we will add  another medication-perhaps Bystolic.

## 2011-09-01 NOTE — Progress Notes (Signed)
Joshua York Date of Birth  1928/12/27 [facility]     [place] [facility]  [address]    [address]   [address] [place] [address] [place], [place]  [address]    [place], [place]  [address] [phone]  Fax  [phone]  [phone]  Fax [phone]  Problem list 1. Hypertension 2. Hyperlipidemia 3. Diabetes mellitus 4. Recent inguinal hernia repair   History of Present Illness:  Joshua York is an 78 year old gentleman. He's done fairly well since I saw him a year ago. He's noticed that his blood pressure has been a little bit elevated since his hernia repair in January. He avoids eating in the extra salt. His diet has not changed.    Current Outpatient Prescriptions on File Prior to Visit  Medication Sig Dispense Refill  . amLODipine (NORVASC) 10 MG tablet Take 10 mg by mouth every morning.       Marland Kitchen aspirin 81 MG tablet Take 81 mg by mouth daily.        . clopidogrel (PLAVIX) 75 MG tablet Take 75 mg by mouth daily.        Marland Kitchen glipiZIDE (GLUCOTROL) 10 MG tablet Take 10 mg by mouth every morning.       . latanoprost (XALATAN) 0.005 % ophthalmic solution Place 1 drop into both eyes at bedtime.       . lovastatin (MEVACOR) 20 MG tablet Take 20 mg by mouth every evening.       . metFORMIN (GLUCOPHAGE) 500 MG tablet Take 500 mg by mouth 2 (two) times daily.       Marland Kitchen olmesartan (BENICAR) 20 MG tablet Take 20 mg by mouth every morning.       . pioglitazone (ACTOS) 30 MG tablet Take 30 mg by mouth every morning.       . trandolapril (MAVIK) 4 MG tablet Take 4 mg by mouth every morning.         Allergies  Allergen Reactions  . Hctz (Hydrochlorothiazide)     Depletes sodium  . Iodinated Diagnostic Agents   . Novocain   . Shellfish-Derived Products     Past Medical History  Diagnosis Date  . Diabetes mellitus   . Hyperlipidemia   . Hypertension   . Chest pain   . Diastolic dysfunction   . Mild aortic sclerosis   . MR (mitral regurgitation)     MILD  . TR (tricuspid  regurgitation)     MILD  . Glaucoma   . Hypercholesteremia   . Depression   . Peripheral vascular disease   . Stroke     History of TIA  . Cancer     skin cancer  . Inguinal hernia     LIH    Past Surgical History  Procedure Date  . Basal carcinoma reoved 6times within past 10years 1995 to 2005    6 basal cell carcinomas removed  . Hernia repair 1980    RIH  . Fracture surgery 1947    broken cheekbone  . Colonoscopy   . Inguinal hernia repair 06/16/2011    Procedure: HERNIA REPAIR INGUINAL ADULT;  Surgeon: Joshua Pu. Cornett, MD;  Location: [facility];  Service: General;  Laterality: Left;  LEFT INGUINAL HERNIA REPAIR    History  Smoking status  . Former Smoker  . Types: Cigarettes  . Quit date: 08/28/1978  Smokeless tobacco  . Not on file    History  Alcohol Use  . 3.6 oz/week  . 6  Shots of liquor per week    Family History  Problem Relation Age of Onset  . Heart attack Father 6  . Hypertension Father   . Heart disease Father   . Other Mother 56    OLD AGE  . Diabetes Brother   . Heart disease Brother   . Hypertension Brother   . Heart attack Brother   . Cancer Brother     lung    Reviw of Systems:  Reviewed in the HPI.  All other systems are negative.  Physical Exam: Blood pressure 151/72, pulse 65, height 5\' 8"  (1.727 m), weight 157 lb 6.4 oz (71.396 kg). General: Well developed, well nourished, in no acute distress.  Head: Normocephalic, atraumatic, sclera non-icteric, mucus membranes are moist,   Neck: Supple. Carotids are 2 + without bruits. No JVD  Lungs: Clear bilaterally to auscultation.  Heart: regular rate.  normal  S1 S2. He has a soft systolic murmur.  Abdomen: Soft, non-tender, non-distended with normal bowel sounds. No hepatomegaly. No rebound/guarding. No masses.  Msk:  Strength and tone are normal  Extremities: No clubbing or cyanosis. No edema.  Distal pedal pulses are 2+ and equal bilaterally.  Neuro: Alert  and oriented X 3. Moves all extremities spontaneously.  Psych:  Responds to questions appropriately with a normal affect.  ECG:  Assessment / Plan:

## 2011-09-01 NOTE — Assessment & Plan Note (Signed)
Stable.  Will check fasting labs this week.

## 2011-09-01 NOTE — Patient Instructions (Signed)
Your physician recommends that you return for a FASTING lipid profile: this week and in 1 year  Your physician wants you to follow-up in: 1 year You will receive a reminder letter in the mail two months in advance. If you don't receive a letter, please call our office to schedule the follow-up appointment.

## 2011-09-03 ENCOUNTER — Other Ambulatory Visit (INDEPENDENT_AMBULATORY_CARE_PROVIDER_SITE_OTHER): Payer: Medicare Other

## 2011-09-03 DIAGNOSIS — E785 Hyperlipidemia, unspecified: Secondary | ICD-10-CM

## 2011-09-03 LAB — LIPID PANEL
Cholesterol: 132 mg/dL (ref 0–200)
VLDL: 14.2 mg/dL (ref 0.0–40.0)

## 2011-09-03 LAB — BASIC METABOLIC PANEL
BUN: 16 mg/dL (ref 6–23)
GFR: 72.82 mL/min (ref >60.00)
Glucose, Bld: 87 mg/dL (ref 70–99)
Potassium: 4 mEq/L (ref 3.5–5.1)

## 2011-09-03 LAB — HEPATIC FUNCTION PANEL
AST: 20 U/L (ref 0–37)
Albumin: 4 g/dL (ref 3.5–5.2)

## 2011-10-23 DIAGNOSIS — E871 Hypo-osmolality and hyponatremia: Secondary | ICD-10-CM | POA: Diagnosis not present

## 2011-10-23 DIAGNOSIS — E119 Type 2 diabetes mellitus without complications: Secondary | ICD-10-CM | POA: Diagnosis not present

## 2011-11-24 ENCOUNTER — Other Ambulatory Visit: Payer: Self-pay | Admitting: Dermatology

## 2011-11-24 DIAGNOSIS — L570 Actinic keratosis: Secondary | ICD-10-CM | POA: Diagnosis not present

## 2011-11-24 DIAGNOSIS — C44319 Basal cell carcinoma of skin of other parts of face: Secondary | ICD-10-CM | POA: Diagnosis not present

## 2011-11-24 DIAGNOSIS — L821 Other seborrheic keratosis: Secondary | ICD-10-CM | POA: Diagnosis not present

## 2011-12-17 DIAGNOSIS — C44319 Basal cell carcinoma of skin of other parts of face: Secondary | ICD-10-CM | POA: Diagnosis not present

## 2012-01-04 DIAGNOSIS — H4011X0 Primary open-angle glaucoma, stage unspecified: Secondary | ICD-10-CM | POA: Diagnosis not present

## 2012-01-04 DIAGNOSIS — H409 Unspecified glaucoma: Secondary | ICD-10-CM | POA: Diagnosis not present

## 2012-02-01 DIAGNOSIS — Z23 Encounter for immunization: Secondary | ICD-10-CM | POA: Diagnosis not present

## 2012-02-17 DIAGNOSIS — E119 Type 2 diabetes mellitus without complications: Secondary | ICD-10-CM | POA: Diagnosis not present

## 2012-02-17 DIAGNOSIS — E871 Hypo-osmolality and hyponatremia: Secondary | ICD-10-CM | POA: Diagnosis not present

## 2012-03-24 IMAGING — US US CAROTID DUPLEX BILAT
1 series · 13 of 24 positions shown · non-contrast
Comparison: None.

CLINICAL DATA: History of diabetes and hypertension, former smoker,
who had an episode of near syncope and visual disturbances
approximately 2 weeks ago.

BILATERAL CAROTID DUPLEX ULTRASOUND 11/07/2010:
TECHNIQUE: Gray scale imaging, color Doppler and duplex ultrasound
was performed of bilateral carotid and vertebral arteries in the
neck.

[Series 1: us carotid duplex bilat · 0.07mm/px · 13 of 67 slices shown]
[im 1/67]
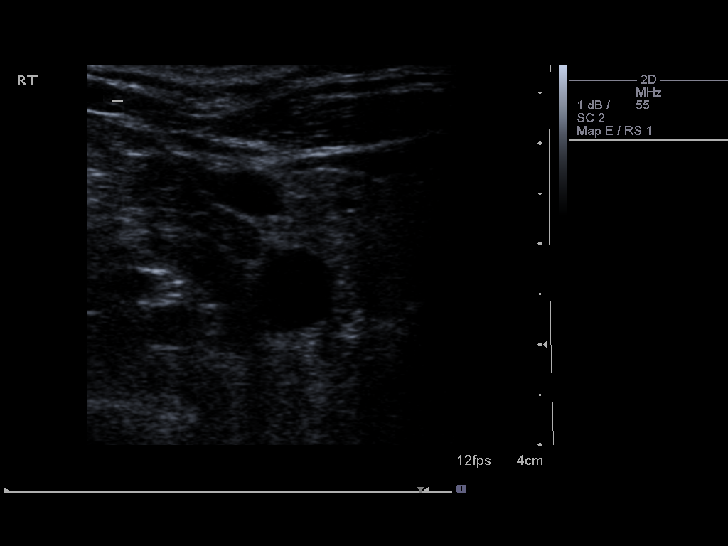
[im 6/67]
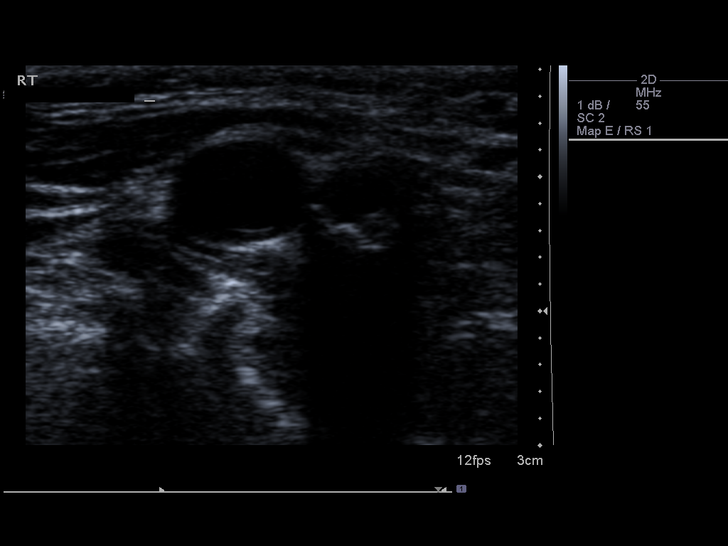
[im 12/67]
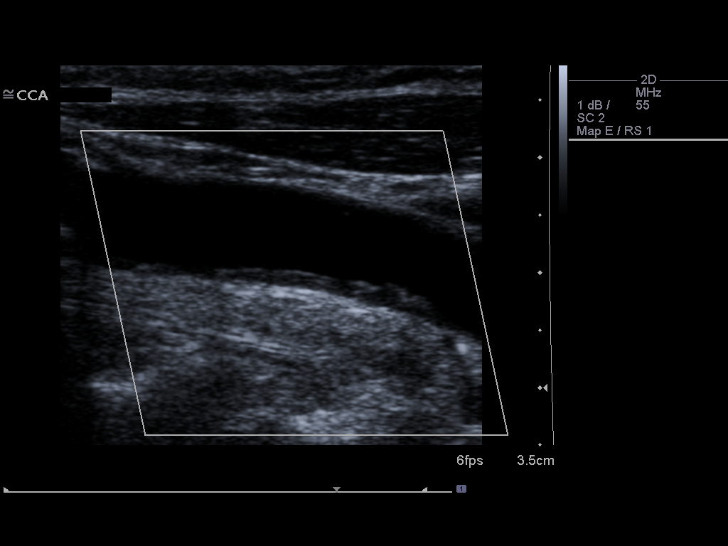
[im 18/67]
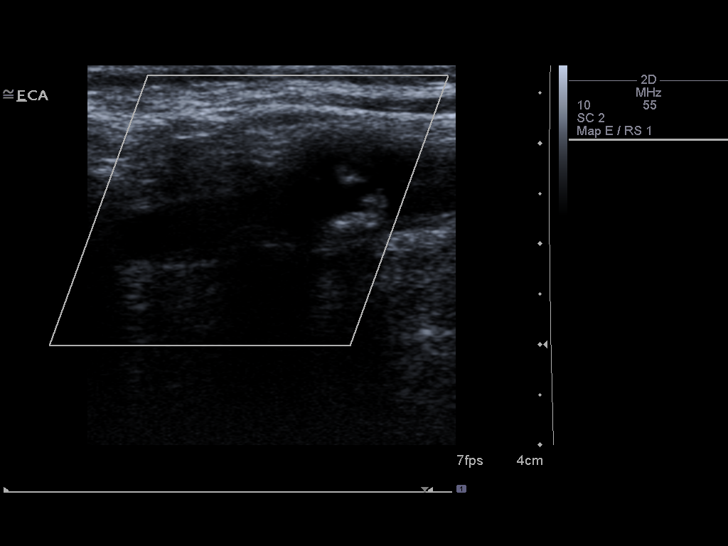
[im 23/67]
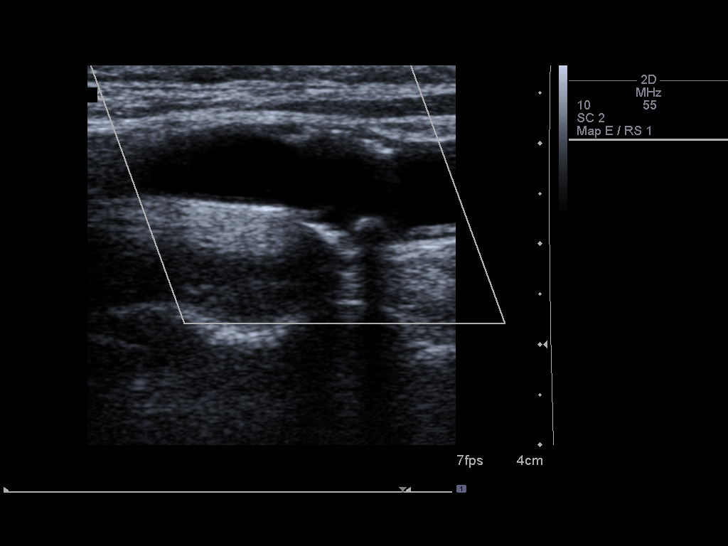
[im 29/67]
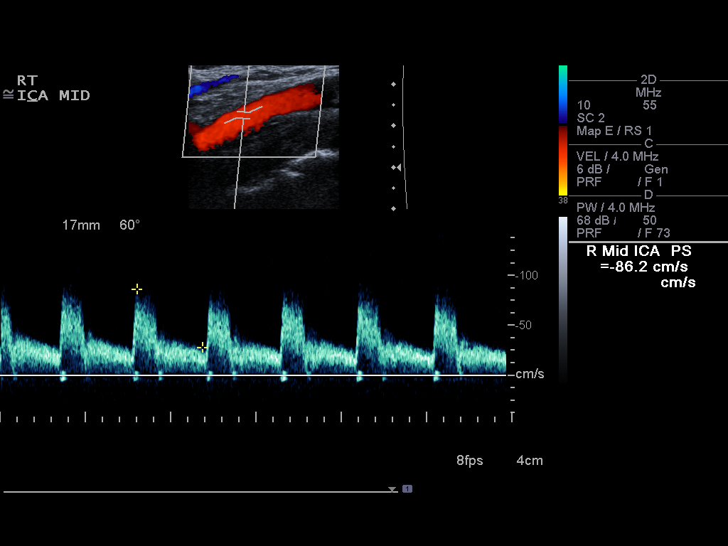
[im 35/67]
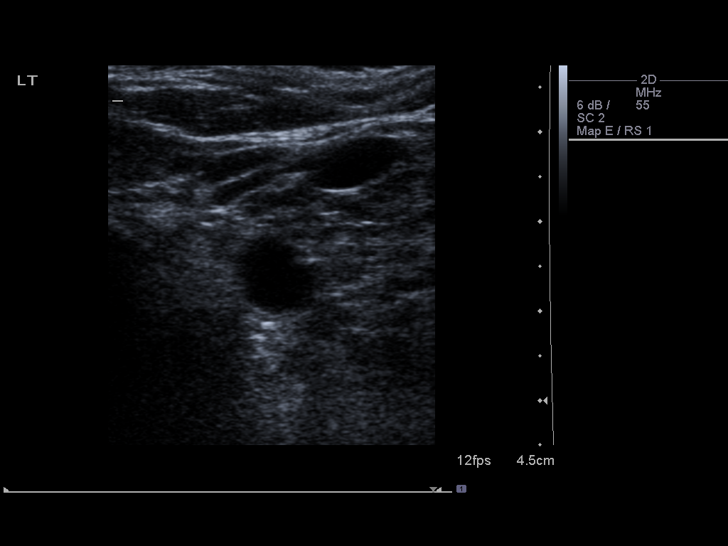
[im 38/67]
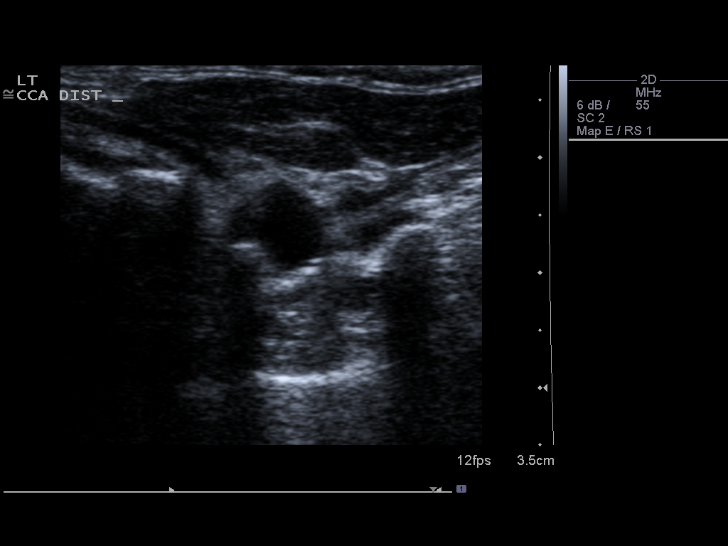
[im 44/67]
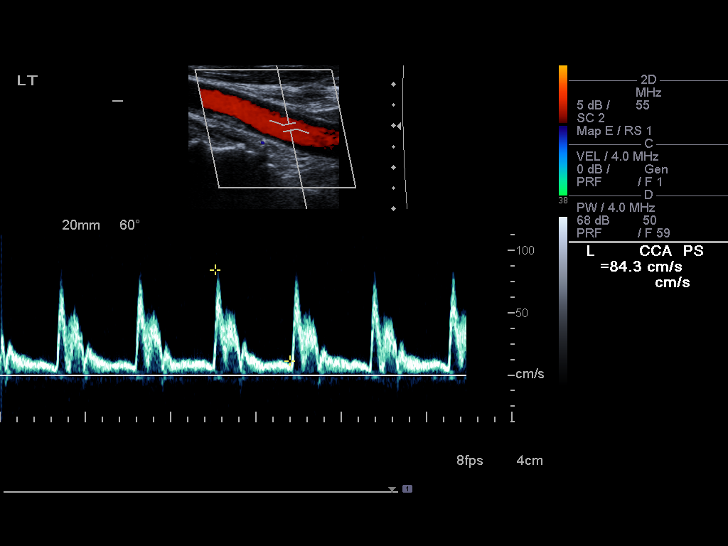
[im 49/67]
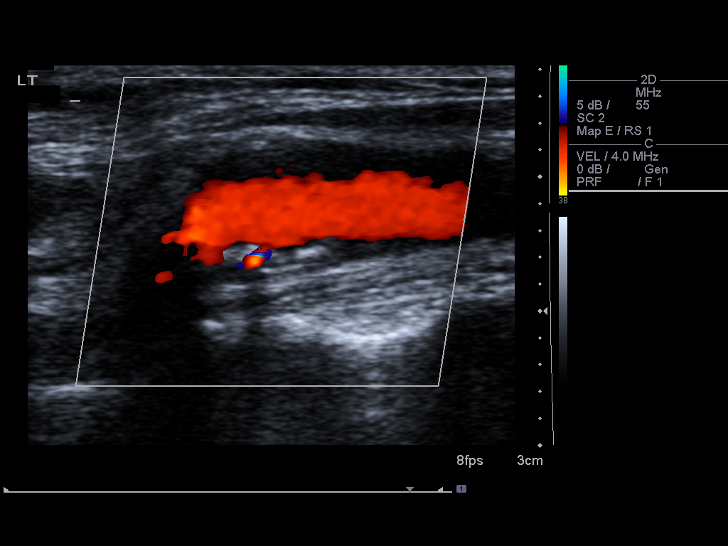
[im 55/67]
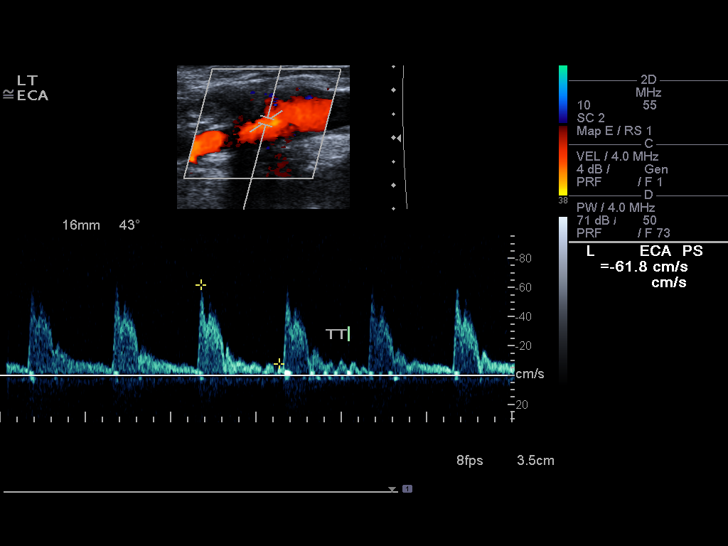
[im 61/67]
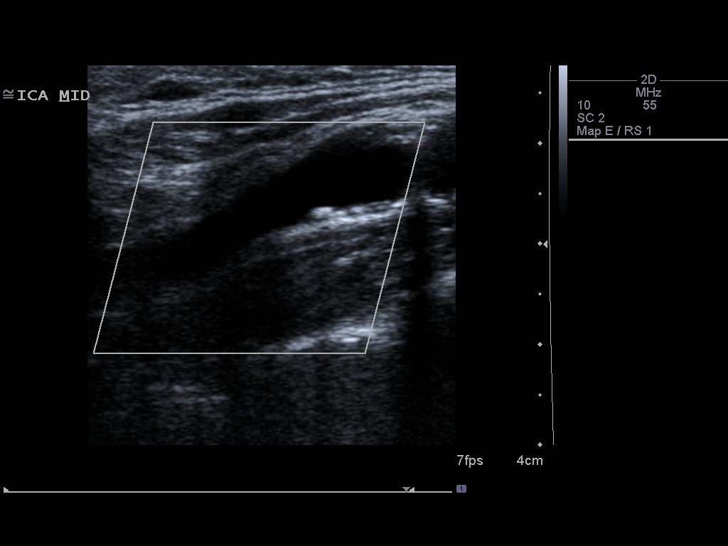
[im 67/67]
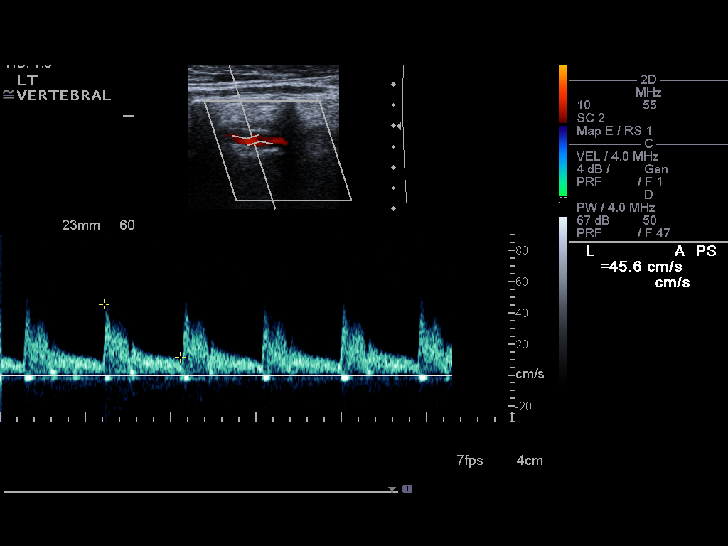

[13 of 24 positions shown; findings below may reference images not displayed]

Criteria:  Quantification of carotid stenosis is based on velocity
parameters that correlate the residual internal carotid diameter
with NASCET-based stenosis levels, using the diameter of the distal
internal carotid lumen as the denominator for stenosis measurement.

The following velocity measurements were obtained:

                 PEAK SYSTOLIC/END DIASTOLIC
RIGHT
ICA:                        100/30cm/sec
CCA:                        94/19cm/sec
SYSTOLIC ICA/CCA RATIO:
DIASTOLIC ICA/CCA RATIO:
ECA:                        108/12cm/sec

LEFT
ICA:                        60/20cm/sec
CCA:                        99/13cm/sec
SYSTOLIC ICA/CCA RATIO:
DIASTOLIC ICA/CCA RATIO:
ECA:                        70th of fivecm/sec
FINDINGS: RIGHT CAROTID ARTERY: Scattered calcified plaques involving the mid
and distal CCA.  Moderate predominately calcified plaque involving
the carotid bulb and proximal ICA and ECA.  No visible stenosis of
greater than 50% diameter at gray scale or color imaging.  Mild
spectral broadening involving the proximal ICA waveform.

RIGHT VERTEBRAL ARTERY:  Antegrade flow with normal waveform.

LEFT CAROTID ARTERY: Moderate mixed calcified and noncalcified
plaque involving the mid and distal CCA, the carotid bulb, and the
proximal ICA and ECA.  No visible stenosis of greater than 50%
diameter at gray scale or color imaging.  Spectral broadening
involving the ICA waveform.

LEFT VERTEBRAL ARTERY:  Antegrade flow with normal waveform.
IMPRESSION: 1.  No evidence of hemodynamically significant stenosis involving
either the right or left carotid circulation in the neck by Doppler
criteria.
2.  Mild to moderate calcified and noncalcified plaque involving
the carotid circulations bilaterally, left greater than right.
3.  Antegrade flow in both vertebral arteries.

## 2012-04-25 ENCOUNTER — Other Ambulatory Visit: Payer: Self-pay | Admitting: Dermatology

## 2012-04-25 DIAGNOSIS — L821 Other seborrheic keratosis: Secondary | ICD-10-CM | POA: Diagnosis not present

## 2012-04-25 DIAGNOSIS — Z85828 Personal history of other malignant neoplasm of skin: Secondary | ICD-10-CM | POA: Diagnosis not present

## 2012-04-25 DIAGNOSIS — D485 Neoplasm of uncertain behavior of skin: Secondary | ICD-10-CM | POA: Diagnosis not present

## 2012-04-25 DIAGNOSIS — L570 Actinic keratosis: Secondary | ICD-10-CM | POA: Diagnosis not present

## 2012-04-25 DIAGNOSIS — C44319 Basal cell carcinoma of skin of other parts of face: Secondary | ICD-10-CM | POA: Diagnosis not present

## 2012-05-23 DIAGNOSIS — Z0000 Encounter for general adult medical examination without abnormal findings: Secondary | ICD-10-CM | POA: Diagnosis not present

## 2012-05-23 DIAGNOSIS — Z125 Encounter for screening for malignant neoplasm of prostate: Secondary | ICD-10-CM | POA: Diagnosis not present

## 2012-05-23 DIAGNOSIS — I10 Essential (primary) hypertension: Secondary | ICD-10-CM | POA: Diagnosis not present

## 2012-05-23 DIAGNOSIS — R82998 Other abnormal findings in urine: Secondary | ICD-10-CM | POA: Diagnosis not present

## 2012-05-23 DIAGNOSIS — G459 Transient cerebral ischemic attack, unspecified: Secondary | ICD-10-CM | POA: Diagnosis not present

## 2012-05-23 DIAGNOSIS — E7800 Pure hypercholesterolemia, unspecified: Secondary | ICD-10-CM | POA: Diagnosis not present

## 2012-05-23 DIAGNOSIS — E1149 Type 2 diabetes mellitus with other diabetic neurological complication: Secondary | ICD-10-CM | POA: Diagnosis not present

## 2012-05-23 DIAGNOSIS — Z1331 Encounter for screening for depression: Secondary | ICD-10-CM | POA: Diagnosis not present

## 2012-05-31 DIAGNOSIS — C44319 Basal cell carcinoma of skin of other parts of face: Secondary | ICD-10-CM | POA: Diagnosis not present

## 2012-08-30 DIAGNOSIS — H2510 Age-related nuclear cataract, unspecified eye: Secondary | ICD-10-CM | POA: Diagnosis not present

## 2012-09-13 DIAGNOSIS — H2510 Age-related nuclear cataract, unspecified eye: Secondary | ICD-10-CM | POA: Diagnosis not present

## 2012-09-13 DIAGNOSIS — H409 Unspecified glaucoma: Secondary | ICD-10-CM | POA: Diagnosis not present

## 2012-09-13 DIAGNOSIS — H4011X0 Primary open-angle glaucoma, stage unspecified: Secondary | ICD-10-CM | POA: Diagnosis not present

## 2012-10-26 DIAGNOSIS — E119 Type 2 diabetes mellitus without complications: Secondary | ICD-10-CM | POA: Diagnosis not present

## 2012-10-26 DIAGNOSIS — D649 Anemia, unspecified: Secondary | ICD-10-CM | POA: Diagnosis not present

## 2012-10-26 IMAGING — CR DG CHEST 2V
2 series · 2 of 2 positions shown · non-contrast
Comparison: Chest x-ray of 06/02/2010

CLINICAL DATA: Preop for repair of inguinal hernia, former smoker

CHEST - 2 VIEW

[w chest pa]
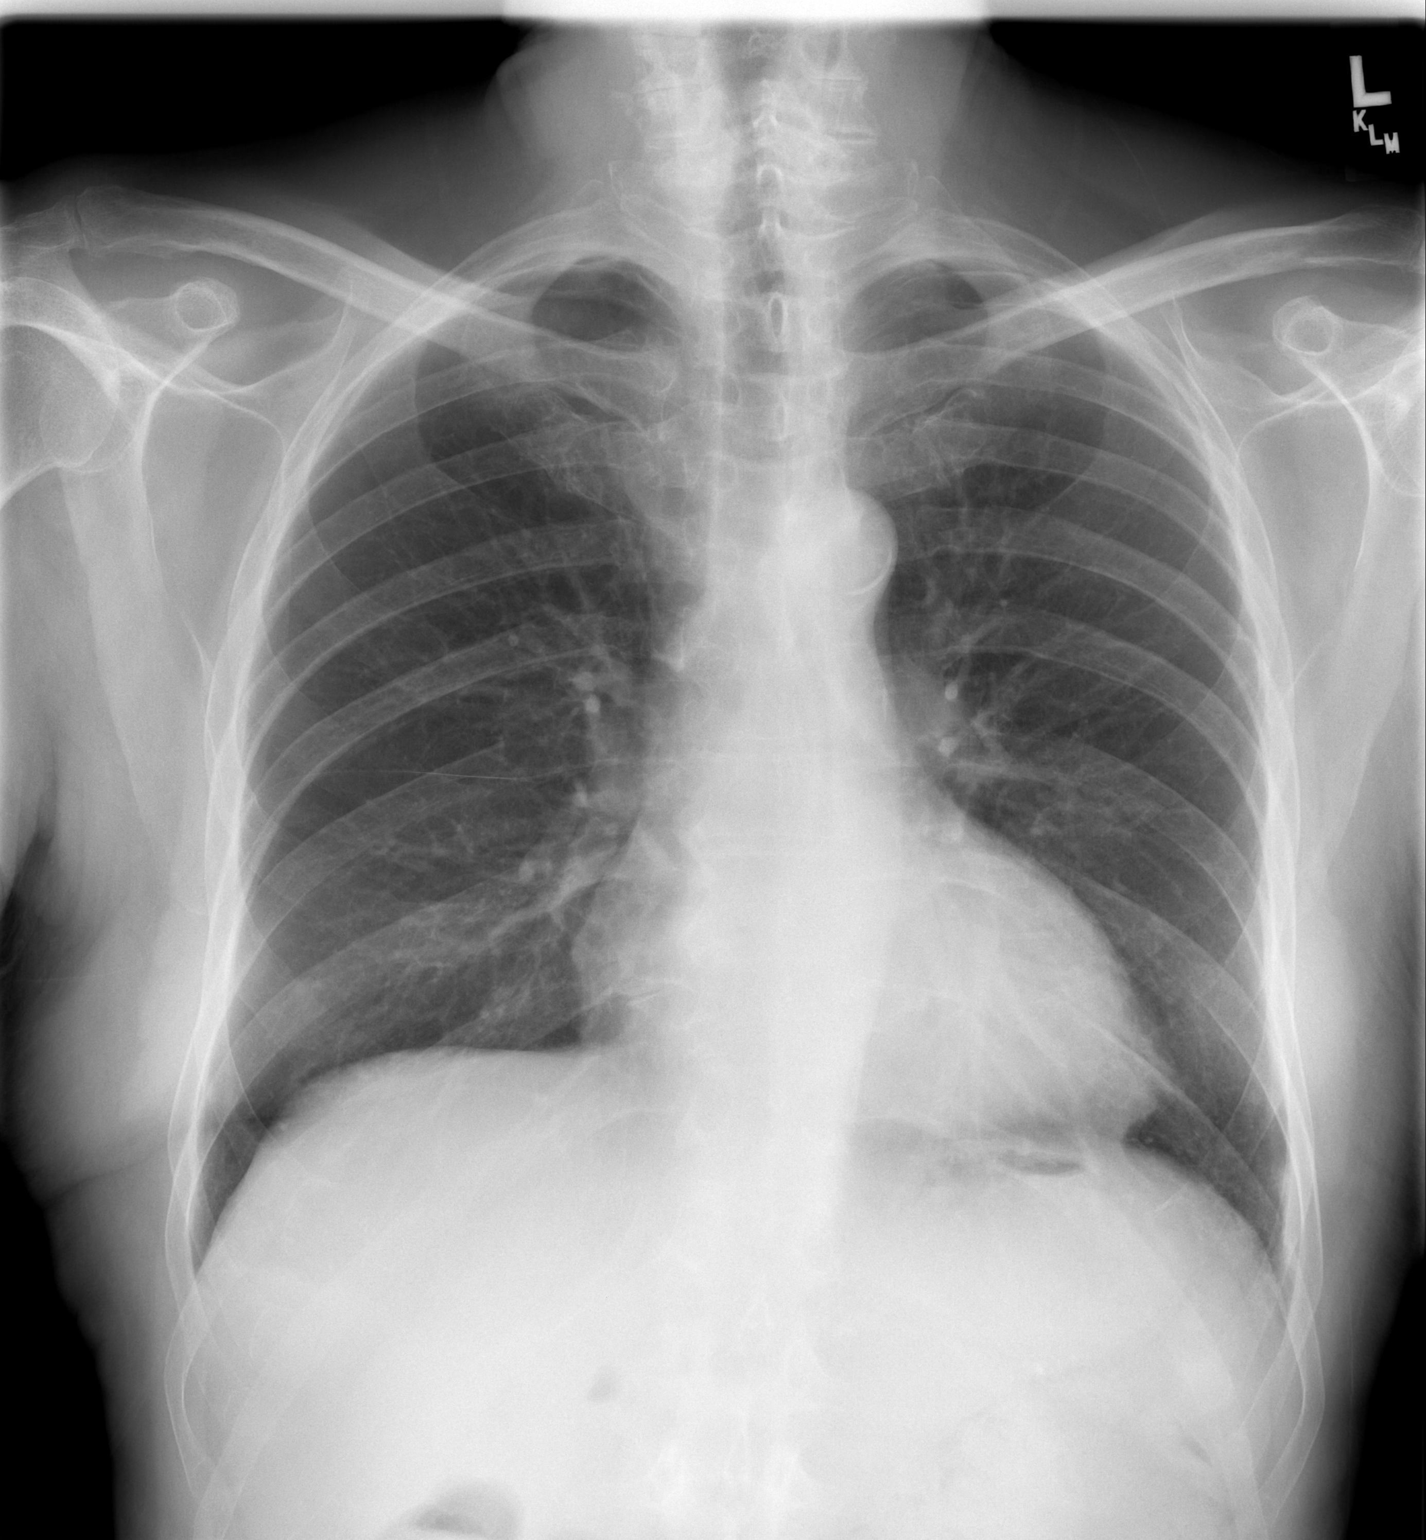

[w chest lat]
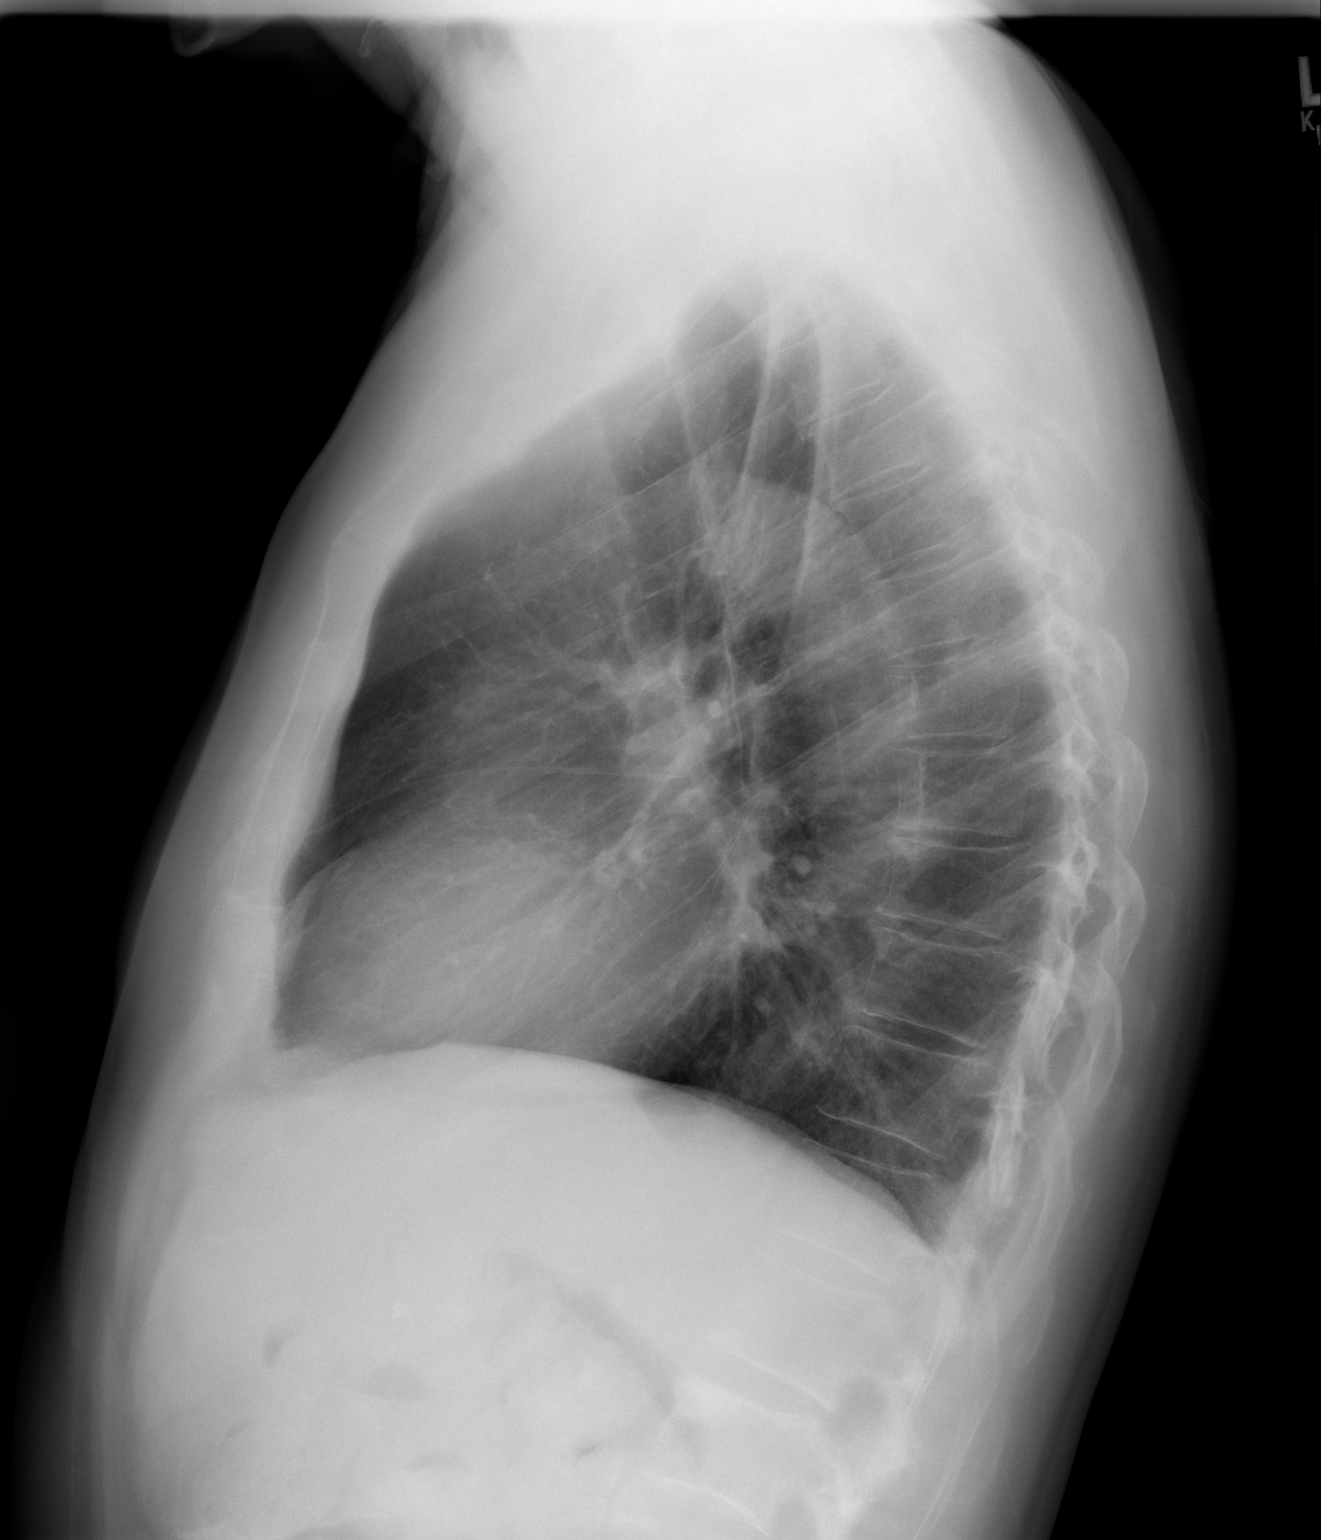

[2 of 2 positions shown; findings below may reference images not displayed]

FINDINGS: The lungs are clear.  Mediastinal contours appear normal.
The heart is borderline enlarged.  No bony abnormality is seen.
IMPRESSION: No active lung disease.  Stable borderline cardiomegaly.

## 2012-12-05 ENCOUNTER — Other Ambulatory Visit: Payer: Self-pay | Admitting: Dermatology

## 2012-12-05 DIAGNOSIS — Z85828 Personal history of other malignant neoplasm of skin: Secondary | ICD-10-CM | POA: Diagnosis not present

## 2012-12-05 DIAGNOSIS — L821 Other seborrheic keratosis: Secondary | ICD-10-CM | POA: Diagnosis not present

## 2012-12-05 DIAGNOSIS — D485 Neoplasm of uncertain behavior of skin: Secondary | ICD-10-CM | POA: Diagnosis not present

## 2012-12-05 DIAGNOSIS — C4441 Basal cell carcinoma of skin of scalp and neck: Secondary | ICD-10-CM | POA: Diagnosis not present

## 2012-12-05 DIAGNOSIS — L570 Actinic keratosis: Secondary | ICD-10-CM | POA: Diagnosis not present

## 2012-12-05 DIAGNOSIS — C44711 Basal cell carcinoma of skin of unspecified lower limb, including hip: Secondary | ICD-10-CM | POA: Diagnosis not present

## 2012-12-05 DIAGNOSIS — C44319 Basal cell carcinoma of skin of other parts of face: Secondary | ICD-10-CM | POA: Diagnosis not present

## 2013-01-02 DIAGNOSIS — Z85828 Personal history of other malignant neoplasm of skin: Secondary | ICD-10-CM | POA: Diagnosis not present

## 2013-01-02 DIAGNOSIS — C44111 Basal cell carcinoma of skin of unspecified eyelid, including canthus: Secondary | ICD-10-CM | POA: Diagnosis not present

## 2013-02-02 DIAGNOSIS — Z23 Encounter for immunization: Secondary | ICD-10-CM | POA: Diagnosis not present

## 2013-03-14 DIAGNOSIS — H4011X0 Primary open-angle glaucoma, stage unspecified: Secondary | ICD-10-CM | POA: Diagnosis not present

## 2013-03-14 DIAGNOSIS — H409 Unspecified glaucoma: Secondary | ICD-10-CM | POA: Diagnosis not present

## 2013-03-14 DIAGNOSIS — H2510 Age-related nuclear cataract, unspecified eye: Secondary | ICD-10-CM | POA: Diagnosis not present

## 2013-03-20 DIAGNOSIS — H2510 Age-related nuclear cataract, unspecified eye: Secondary | ICD-10-CM | POA: Diagnosis not present

## 2013-03-20 DIAGNOSIS — H4011X0 Primary open-angle glaucoma, stage unspecified: Secondary | ICD-10-CM | POA: Diagnosis not present

## 2013-03-20 DIAGNOSIS — H25019 Cortical age-related cataract, unspecified eye: Secondary | ICD-10-CM | POA: Diagnosis not present

## 2013-03-20 DIAGNOSIS — H409 Unspecified glaucoma: Secondary | ICD-10-CM | POA: Diagnosis not present

## 2013-03-23 ENCOUNTER — Other Ambulatory Visit: Payer: Self-pay | Admitting: Surgery

## 2013-04-17 HISTORY — PX: EYE SURGERY: SHX253

## 2013-04-18 DIAGNOSIS — H25019 Cortical age-related cataract, unspecified eye: Secondary | ICD-10-CM | POA: Diagnosis not present

## 2013-04-18 DIAGNOSIS — H2181 Floppy iris syndrome: Secondary | ICD-10-CM | POA: Diagnosis not present

## 2013-04-18 DIAGNOSIS — H2510 Age-related nuclear cataract, unspecified eye: Secondary | ICD-10-CM | POA: Diagnosis not present

## 2013-04-18 DIAGNOSIS — H2589 Other age-related cataract: Secondary | ICD-10-CM | POA: Diagnosis not present

## 2013-04-21 ENCOUNTER — Encounter: Payer: Self-pay | Admitting: Family

## 2013-04-24 ENCOUNTER — Encounter (INDEPENDENT_AMBULATORY_CARE_PROVIDER_SITE_OTHER): Payer: Self-pay

## 2013-04-24 ENCOUNTER — Other Ambulatory Visit: Payer: Medicare Other

## 2013-04-24 ENCOUNTER — Ambulatory Visit: Payer: Medicare Other | Admitting: Neurosurgery

## 2013-04-24 ENCOUNTER — Other Ambulatory Visit (HOSPITAL_COMMUNITY): Payer: Medicare Other

## 2013-04-24 ENCOUNTER — Encounter: Payer: Self-pay | Admitting: Family

## 2013-04-24 ENCOUNTER — Ambulatory Visit (HOSPITAL_COMMUNITY)
Admission: RE | Admit: 2013-04-24 | Discharge: 2013-04-24 | Disposition: A | Discharge status: Home / Self Care | Payer: Medicare Other | Source: Ambulatory Visit | Attending: Family | Admitting: Family

## 2013-04-24 ENCOUNTER — Ambulatory Visit (INDEPENDENT_AMBULATORY_CARE_PROVIDER_SITE_OTHER): Payer: Medicare Other | Admitting: Family

## 2013-04-24 DIAGNOSIS — I6529 Occlusion and stenosis of unspecified carotid artery: Secondary | ICD-10-CM

## 2013-04-24 NOTE — Patient Instructions (Signed)
Stroke Prevention Some medical conditions and behaviors are associated with an increased chance of having a stroke. You may prevent a stroke by making healthy choices and managing medical conditions. Reduce your risk of having a stroke by:  Staying physically active. Get at least 30 minutes of activity on most or all days.  Not smoking. It may also be helpful to avoid exposure to secondhand smoke.  Limiting alcohol use. Moderate alcohol use is considered to be:  No more than 2 drinks per day for men.  No more than 1 drink per day for nonpregnant women.  Eating healthy foods.  Include 5 or more servings of fruits and vegetables a day.  Certain diets may be prescribed to address high blood pressure, high cholesterol, diabetes, or obesity.  Managing your cholesterol levels.  A low-saturated fat, low-trans fat, low-cholesterol, and high-fiber diet may control cholesterol levels.  Take any prescribed medicines to control cholesterol as directed by your caregiver.  Managing your diabetes.  A controlled-carbohydrate, controlled-sugar diet is recommended to manage diabetes.  Take any prescribed medicines to control diabetes as directed by your caregiver.  Controlling your high blood pressure (hypertension).  A low-salt (sodium), low-saturated fat, low-trans fat, and low-cholesterol diet is recommended to manage high blood pressure.  Take any prescribed medicines to control hypertension as directed by your caregiver.  Maintaining a healthy weight.  A reduced-calorie, low-sodium, low-saturated fat, low-trans fat, low-cholesterol diet is recommended to manage weight.  Stopping drug abuse.  Avoiding birth control pills.  Talk to your caregiver about the risks of taking birth control pills if you are over 35 years old, smoke, get migraines, or have ever had a blood clot.  Getting evaluated for sleep disorders (sleep apnea).  Talk to your caregiver about getting a sleep evaluation  if you snore a lot or have excessive sleepiness.  Taking medicines as directed by your caregiver.  For some people, aspirin or blood thinners (anticoagulants) are helpful in reducing the risk of forming abnormal blood clots that can lead to stroke. If you have the irregular heart rhythm of atrial fibrillation, you should be on a blood thinner unless there is a good reason you cannot take them.  Understand all your medicine instructions. SEEK IMMEDIATE MEDICAL CARE IF:   You have sudden weakness or numbness of the face, arm, or leg, especially on one side of the body.  You have sudden confusion.  You have trouble speaking (aphasia) or understanding.  You have sudden trouble seeing in one or both eyes.  You have sudden trouble walking.  You have dizziness.  You have a loss of balance or coordination.  You have a sudden, severe headache with no known cause.  You have new chest pain or an irregular heartbeat. Any of these symptoms may represent a serious problem that is an emergency. Do not wait to see if the symptoms will go away. Get medical help right away. Call your local emergency services (911 in [place] Do not drive yourself to the hospital. Document Released: 06/11/2004 Document Revised: 07/27/2011 Document Reviewed: 11/04/2012 [employer] Patient Information 2014 [employer].  

## 2013-04-24 NOTE — Progress Notes (Signed)
Established Carotid Patient  History of Present Illness  Joshua York is a 79 y.o. male patient who was last seen in this office 2 years ago by Dr. Myra Gianotti. At that time the patient did not have significant extracranial carotid occlusive disease and Dr. Myra Gianotti did not not think this contributed to his stroke. The patienthad 2 neurologic events in 2012, one in June and in early November.  November, 2012 he presented to to the emergency department where he had an episode of confusion self urination and inability to pulled the zipper down on his pants. It lasted several minutes. A full workup in the hospital was done an MRA revealed left MCA (M1) occlusion. Carotid ultrasounds of bilateral atherosclerosis left greater than right but less than 50%. The patient was started on aspirin and Plavix and discharged to home. He did see the neurology stroke service while he was in the hospital. He has had no new symptoms.  After reviewing his MRI Dr. Myra Gianotti felt that most likely the artery was occluded and that he would not benefit from intracranial intervention and that he would be best managed medically which would include antiplatelet therapy (aspirin and Plavix), blood glucose control, lipid management and blood pressure control.  No record of an echocardiogram was found at that time and Dr. Myra Gianotti suggested this be performed to make sure that there is no source for emboli. Dr. Myra Gianotti advised the patient to return in 2 years for carotid Duplex and he is here today for this.  Patient has not had previous carotid artery intervention.  Patient denies ever having a stroke, his neurologist, Dr. Anne Hahn, told him that he did not have a stroke, he did have an intracranial arterial occlusion. His PCP found that his sodium was depleted and his HCTZ was stopped. Patient states he has not had any further stoke or TIA symptoms after 2012. He walks a mile every other day.  The patient denies amaurosis fugax or  monocular blindness.  The patient  denies facial drooping.  Pt. denies hemiplegia.  The patient denies receptive or expressive aphasia.  Pt. denies extremity weakness.   He reports New Medical or Surgical History: left cataract extraction a week ago, planned right cataract extraction.  Patient denies claudication symptoms, denies non-healing wounds.   Pt Diabetic: Yes, states in control Pt smoker: former smoker, quit 35 years ago  Pt meds include: Statin : Yes ASA: Yes Other anticoagulants/antiplatelets: Plavix   Past Medical History  Diagnosis Date  . Diabetes mellitus   . Hyperlipidemia   . Hypertension   . Chest pain   . Diastolic dysfunction   . Mild aortic sclerosis   . MR (mitral regurgitation)     MILD  . TR (tricuspid regurgitation)     MILD  . Glaucoma   . Hypercholesteremia   . Depression   . Peripheral vascular disease   . Stroke     History of TIA  . Cancer     skin cancer  . Inguinal hernia     LIH    Social History History  Substance Use Topics  . Smoking status: Former Smoker    Types: Cigarettes    Quit date: 08/28/1978  . Smokeless tobacco: Not on file  . Alcohol Use: 3.6 oz/week    6 Shots of liquor per week    Family History Family History  Problem Relation Age of Onset  . Heart attack Father 48  . Hypertension Father   . Heart disease Father   .  Other Mother 14    OLD AGE  . Diabetes Brother   . Heart disease Brother   . Hypertension Brother   . Heart attack Brother   . Cancer Brother     lung    Surgical History Past Surgical History  Procedure Laterality Date  . Basal carcinoma reoved 6times within past 10years  1995 to 2005    6 basal cell carcinomas removed  . Hernia repair  1980    RIH  . Fracture surgery  1947    broken cheekbone  . Colonoscopy    . Inguinal hernia repair  06/16/2011    Procedure: HERNIA REPAIR INGUINAL ADULT;  Surgeon: Clovis Pu. Cornett, MD;  Location: [facility];  Service: General;   Laterality: Left;  LEFT INGUINAL HERNIA REPAIR    Allergies  Allergen Reactions  . Hctz [Hydrochlorothiazide]     Depletes sodium  . Iodinated Diagnostic Agents   . Procaine Hcl   . Shellfish-Derived Products     Current Outpatient Prescriptions  Medication Sig Dispense Refill  . amLODipine (NORVASC) 10 MG tablet Take 10 mg by mouth every morning.       Marland Kitchen aspirin 81 MG tablet Take 81 mg by mouth daily.        . clopidogrel (PLAVIX) 75 MG tablet Take 75 mg by mouth daily.        Marland Kitchen glipiZIDE (GLUCOTROL) 10 MG tablet Take 10 mg by mouth every morning.       . latanoprost (XALATAN) 0.005 % ophthalmic solution Place 1 drop into both eyes at bedtime.       . lovastatin (MEVACOR) 20 MG tablet Take 20 mg by mouth every evening.       . metFORMIN (GLUCOPHAGE) 500 MG tablet Take 500 mg by mouth 2 (two) times daily.       Marland Kitchen olmesartan (BENICAR) 20 MG tablet Take 20 mg by mouth every morning.       . pioglitazone (ACTOS) 30 MG tablet Take 30 mg by mouth every morning.       . trandolapril (MAVIK) 4 MG tablet Take 4 mg by mouth every morning.        No current facility-administered medications for this visit.   Physical Examination  Filed Vitals:   04/24/13 1033  BP: 168/75  Pulse: 53  Resp: 16   Filed Weights   04/24/13 1033  Weight: 166 lb (75.297 kg)   Body mass index is 25.25 kg/(m^2).  General: WDWN male in NAD GAIT: normal Eyes: PERRLA Pulmonary:  CTAB, Negative  Rales, Negative rhonchi, & Negative wheezing.  Cardiac: regular Rhythm ,  Negative Murmurs.  VASCULAR EXAM Carotid Bruits Left Right   Negative Negative     Radial pulses are 3+ palpable and equal.                                                                                                                            LE  Pulses LEFT RIGHT       POPLITEAL  not palpable   not palpable    Gastrointestinal: soft, nontender, BS WNL, no r/g,  negative masses.  Musculoskeletal: Negative muscle  atrophy/wasting. M/S 5/5 throughout, Extremities without ischemic changes.  Neurologic: A&O X 3; Appropriate Affect ; SENSATION ;normal;  Speech is normal CN 2-12 intact, Pain and light touch intact in extremities, Motor exam as listed above.   Non-Invasive Vascular Imaging CAROTID DUPLEX 04/24/2013   Right ICA: <40% stenosis. Left ICA: <40% stenosis.  Review of his 2012 echocardiogram shows no evidence of mural thrombus.   Assessment: Joshua York is a 79 y.o. male who presents with asymptomatic minimal Bilateral ICA  Stenosis.  Plan:Follow-up in 1 year with Carotid Duplex scan.  I discussed in depth with the patient the nature of atherosclerosis, and emphasized the importance of maximal medical management including strict control of blood pressure, blood glucose, and lipid levels, obtaining regular exercise, and continued cessation of smoking.  The patient is aware that without maximal medical management the underlying atherosclerotic disease process will progress, limiting the benefit of any interventions. The patient was given information about stroke prevention and what symptoms should prompt the patient to seek immediate medical care. Thank you for allowing [place] to participate in this patient's care.  Charisse March, RN, MSN, FNP-C [facility] Office: [phone]  Clinic Physician: Hart Rochester  04/24/2013 9:14 AM

## 2013-04-24 NOTE — Addendum Note (Signed)
Addended by: Melodye Ped C on: 04/24/2013 03:50 PM   Modules accepted: Orders

## 2013-04-28 DIAGNOSIS — E119 Type 2 diabetes mellitus without complications: Secondary | ICD-10-CM | POA: Diagnosis not present

## 2013-05-16 DIAGNOSIS — H2589 Other age-related cataract: Secondary | ICD-10-CM | POA: Diagnosis not present

## 2013-05-16 DIAGNOSIS — H2510 Age-related nuclear cataract, unspecified eye: Secondary | ICD-10-CM | POA: Diagnosis not present

## 2013-05-16 DIAGNOSIS — H25019 Cortical age-related cataract, unspecified eye: Secondary | ICD-10-CM | POA: Diagnosis not present

## 2013-05-24 ENCOUNTER — Other Ambulatory Visit: Payer: Self-pay | Admitting: Dermatology

## 2013-05-24 DIAGNOSIS — L821 Other seborrheic keratosis: Secondary | ICD-10-CM | POA: Diagnosis not present

## 2013-05-24 DIAGNOSIS — C44319 Basal cell carcinoma of skin of other parts of face: Secondary | ICD-10-CM | POA: Diagnosis not present

## 2013-05-24 DIAGNOSIS — L570 Actinic keratosis: Secondary | ICD-10-CM | POA: Diagnosis not present

## 2013-05-24 DIAGNOSIS — L905 Scar conditions and fibrosis of skin: Secondary | ICD-10-CM | POA: Diagnosis not present

## 2013-05-24 DIAGNOSIS — Z85828 Personal history of other malignant neoplasm of skin: Secondary | ICD-10-CM | POA: Diagnosis not present

## 2013-05-24 DIAGNOSIS — D485 Neoplasm of uncertain behavior of skin: Secondary | ICD-10-CM | POA: Diagnosis not present

## 2013-05-25 DIAGNOSIS — E1149 Type 2 diabetes mellitus with other diabetic neurological complication: Secondary | ICD-10-CM | POA: Diagnosis not present

## 2013-05-25 DIAGNOSIS — Q159 Congenital malformation of eye, unspecified: Secondary | ICD-10-CM | POA: Diagnosis not present

## 2013-05-25 DIAGNOSIS — C4491 Basal cell carcinoma of skin, unspecified: Secondary | ICD-10-CM | POA: Diagnosis not present

## 2013-05-25 DIAGNOSIS — H4050X0 Glaucoma secondary to other eye disorders, unspecified eye, stage unspecified: Secondary | ICD-10-CM | POA: Diagnosis not present

## 2013-05-25 DIAGNOSIS — G459 Transient cerebral ischemic attack, unspecified: Secondary | ICD-10-CM | POA: Diagnosis not present

## 2013-05-25 DIAGNOSIS — Z0000 Encounter for general adult medical examination without abnormal findings: Secondary | ICD-10-CM | POA: Diagnosis not present

## 2013-05-25 DIAGNOSIS — E7800 Pure hypercholesterolemia, unspecified: Secondary | ICD-10-CM | POA: Diagnosis not present

## 2013-05-25 DIAGNOSIS — Z1331 Encounter for screening for depression: Secondary | ICD-10-CM | POA: Diagnosis not present

## 2013-05-25 DIAGNOSIS — I10 Essential (primary) hypertension: Secondary | ICD-10-CM | POA: Diagnosis not present

## 2013-05-30 DIAGNOSIS — C44320 Squamous cell carcinoma of skin of unspecified parts of face: Secondary | ICD-10-CM | POA: Diagnosis not present

## 2013-05-30 DIAGNOSIS — Z85828 Personal history of other malignant neoplasm of skin: Secondary | ICD-10-CM | POA: Diagnosis not present

## 2013-06-01 DIAGNOSIS — E538 Deficiency of other specified B group vitamins: Secondary | ICD-10-CM | POA: Diagnosis not present

## 2013-06-01 DIAGNOSIS — D649 Anemia, unspecified: Secondary | ICD-10-CM | POA: Diagnosis not present

## 2013-06-01 DIAGNOSIS — D539 Nutritional anemia, unspecified: Secondary | ICD-10-CM | POA: Diagnosis not present

## 2013-06-06 DIAGNOSIS — D649 Anemia, unspecified: Secondary | ICD-10-CM | POA: Diagnosis not present

## 2013-06-06 DIAGNOSIS — Z1211 Encounter for screening for malignant neoplasm of colon: Secondary | ICD-10-CM | POA: Diagnosis not present

## 2013-06-09 DIAGNOSIS — E538 Deficiency of other specified B group vitamins: Secondary | ICD-10-CM | POA: Diagnosis not present

## 2013-06-23 DIAGNOSIS — D510 Vitamin B12 deficiency anemia due to intrinsic factor deficiency: Secondary | ICD-10-CM | POA: Diagnosis not present

## 2013-07-10 DIAGNOSIS — D510 Vitamin B12 deficiency anemia due to intrinsic factor deficiency: Secondary | ICD-10-CM | POA: Diagnosis not present

## 2013-07-21 DIAGNOSIS — D510 Vitamin B12 deficiency anemia due to intrinsic factor deficiency: Secondary | ICD-10-CM | POA: Diagnosis not present

## 2013-08-02 DIAGNOSIS — D510 Vitamin B12 deficiency anemia due to intrinsic factor deficiency: Secondary | ICD-10-CM | POA: Diagnosis not present

## 2013-08-02 DIAGNOSIS — I10 Essential (primary) hypertension: Secondary | ICD-10-CM | POA: Diagnosis not present

## 2013-08-02 DIAGNOSIS — Z23 Encounter for immunization: Secondary | ICD-10-CM | POA: Diagnosis not present

## 2013-08-29 DIAGNOSIS — D510 Vitamin B12 deficiency anemia due to intrinsic factor deficiency: Secondary | ICD-10-CM | POA: Diagnosis not present

## 2013-09-29 DIAGNOSIS — D510 Vitamin B12 deficiency anemia due to intrinsic factor deficiency: Secondary | ICD-10-CM | POA: Diagnosis not present

## 2013-10-16 DIAGNOSIS — C801 Malignant (primary) neoplasm, unspecified: Secondary | ICD-10-CM

## 2013-10-16 HISTORY — DX: Malignant (primary) neoplasm, unspecified: C80.1

## 2013-10-31 DIAGNOSIS — D510 Vitamin B12 deficiency anemia due to intrinsic factor deficiency: Secondary | ICD-10-CM | POA: Diagnosis not present

## 2013-11-21 DIAGNOSIS — D649 Anemia, unspecified: Secondary | ICD-10-CM | POA: Diagnosis not present

## 2013-11-21 DIAGNOSIS — E538 Deficiency of other specified B group vitamins: Secondary | ICD-10-CM | POA: Diagnosis not present

## 2013-11-23 DIAGNOSIS — E538 Deficiency of other specified B group vitamins: Secondary | ICD-10-CM | POA: Diagnosis not present

## 2013-12-05 ENCOUNTER — Other Ambulatory Visit: Payer: Self-pay | Admitting: Dermatology

## 2013-12-05 DIAGNOSIS — Z85828 Personal history of other malignant neoplasm of skin: Secondary | ICD-10-CM | POA: Diagnosis not present

## 2013-12-05 DIAGNOSIS — C44319 Basal cell carcinoma of skin of other parts of face: Secondary | ICD-10-CM | POA: Diagnosis not present

## 2013-12-05 DIAGNOSIS — D485 Neoplasm of uncertain behavior of skin: Secondary | ICD-10-CM | POA: Diagnosis not present

## 2013-12-05 DIAGNOSIS — L570 Actinic keratosis: Secondary | ICD-10-CM | POA: Diagnosis not present

## 2013-12-07 DIAGNOSIS — D510 Vitamin B12 deficiency anemia due to intrinsic factor deficiency: Secondary | ICD-10-CM | POA: Diagnosis not present

## 2013-12-11 DIAGNOSIS — Z961 Presence of intraocular lens: Secondary | ICD-10-CM | POA: Diagnosis not present

## 2013-12-11 DIAGNOSIS — H409 Unspecified glaucoma: Secondary | ICD-10-CM | POA: Diagnosis not present

## 2013-12-11 DIAGNOSIS — H4011X0 Primary open-angle glaucoma, stage unspecified: Secondary | ICD-10-CM | POA: Diagnosis not present

## 2013-12-21 DIAGNOSIS — D510 Vitamin B12 deficiency anemia due to intrinsic factor deficiency: Secondary | ICD-10-CM | POA: Diagnosis not present

## 2014-01-04 DIAGNOSIS — D510 Vitamin B12 deficiency anemia due to intrinsic factor deficiency: Secondary | ICD-10-CM | POA: Diagnosis not present

## 2014-01-17 DIAGNOSIS — C44319 Basal cell carcinoma of skin of other parts of face: Secondary | ICD-10-CM | POA: Diagnosis not present

## 2014-01-17 DIAGNOSIS — Z85828 Personal history of other malignant neoplasm of skin: Secondary | ICD-10-CM | POA: Diagnosis not present

## 2014-01-18 DIAGNOSIS — Z23 Encounter for immunization: Secondary | ICD-10-CM | POA: Diagnosis not present

## 2014-01-18 DIAGNOSIS — D510 Vitamin B12 deficiency anemia due to intrinsic factor deficiency: Secondary | ICD-10-CM | POA: Diagnosis not present

## 2014-01-24 DIAGNOSIS — C44319 Basal cell carcinoma of skin of other parts of face: Secondary | ICD-10-CM | POA: Diagnosis not present

## 2014-01-24 DIAGNOSIS — Z85828 Personal history of other malignant neoplasm of skin: Secondary | ICD-10-CM | POA: Diagnosis not present

## 2014-02-01 DIAGNOSIS — D510 Vitamin B12 deficiency anemia due to intrinsic factor deficiency: Secondary | ICD-10-CM | POA: Diagnosis not present

## 2014-02-15 DIAGNOSIS — D510 Vitamin B12 deficiency anemia due to intrinsic factor deficiency: Secondary | ICD-10-CM | POA: Diagnosis not present

## 2014-02-23 DIAGNOSIS — E538 Deficiency of other specified B group vitamins: Secondary | ICD-10-CM | POA: Diagnosis not present

## 2014-02-23 DIAGNOSIS — D649 Anemia, unspecified: Secondary | ICD-10-CM | POA: Diagnosis not present

## 2014-03-01 DIAGNOSIS — D649 Anemia, unspecified: Secondary | ICD-10-CM | POA: Diagnosis not present

## 2014-03-01 DIAGNOSIS — R8290 Unspecified abnormal findings in urine: Secondary | ICD-10-CM | POA: Diagnosis not present

## 2014-03-12 DIAGNOSIS — D649 Anemia, unspecified: Secondary | ICD-10-CM | POA: Diagnosis not present

## 2014-03-19 DIAGNOSIS — D510 Vitamin B12 deficiency anemia due to intrinsic factor deficiency: Secondary | ICD-10-CM | POA: Diagnosis not present

## 2014-03-20 DIAGNOSIS — R195 Other fecal abnormalities: Secondary | ICD-10-CM | POA: Diagnosis not present

## 2014-03-20 DIAGNOSIS — D649 Anemia, unspecified: Secondary | ICD-10-CM | POA: Diagnosis not present

## 2014-04-02 DIAGNOSIS — D649 Anemia, unspecified: Secondary | ICD-10-CM | POA: Diagnosis not present

## 2014-04-04 ENCOUNTER — Encounter: Payer: Self-pay | Admitting: Neurology

## 2014-04-10 ENCOUNTER — Encounter: Payer: Self-pay | Admitting: Neurology

## 2014-04-18 DIAGNOSIS — D510 Vitamin B12 deficiency anemia due to intrinsic factor deficiency: Secondary | ICD-10-CM | POA: Diagnosis not present

## 2014-04-30 ENCOUNTER — Other Ambulatory Visit (HOSPITAL_COMMUNITY): Payer: Medicare Other

## 2014-04-30 ENCOUNTER — Ambulatory Visit: Payer: Medicare Other | Admitting: Family

## 2014-05-03 ENCOUNTER — Encounter: Payer: Self-pay | Admitting: Family

## 2014-05-04 ENCOUNTER — Ambulatory Visit (HOSPITAL_COMMUNITY)
Admission: RE | Admit: 2014-05-04 | Discharge: 2014-05-04 | Disposition: A | Discharge status: Home / Self Care | Payer: Medicare Other | Source: Ambulatory Visit | Attending: Family | Admitting: Family

## 2014-05-04 ENCOUNTER — Encounter: Payer: Self-pay | Admitting: Family

## 2014-05-04 ENCOUNTER — Ambulatory Visit (INDEPENDENT_AMBULATORY_CARE_PROVIDER_SITE_OTHER): Payer: Medicare Other | Admitting: Family

## 2014-05-04 VITALS — BP 133/67 | HR 50 | Resp 16 | Ht 68.0 in | Wt 165.0 lb

## 2014-05-04 DIAGNOSIS — I6523 Occlusion and stenosis of bilateral carotid arteries: Secondary | ICD-10-CM

## 2014-05-04 NOTE — Patient Instructions (Signed)
Stroke Prevention Some medical conditions and behaviors are associated with an increased chance of having a stroke. You may prevent a stroke by making healthy choices and managing medical conditions. HOW CAN I REDUCE MY RISK OF HAVING A STROKE?   Stay physically active. Get at least 30 minutes of activity on most or all days.  Do not smoke. It may also be helpful to avoid exposure to secondhand smoke.  Limit alcohol use. Moderate alcohol use is considered to be:  No more than 2 drinks per day for men.  No more than 1 drink per day for nonpregnant women.  Eat healthy foods. This involves:  Eating 5 or more servings of fruits and vegetables a day.  Making dietary changes that address high blood pressure (hypertension), high cholesterol, diabetes, or obesity.  Manage your cholesterol levels.  Making food choices that are high in fiber and low in saturated fat, trans fat, and cholesterol may control cholesterol levels.  Take any prescribed medicines to control cholesterol as directed by your health care provider.  Manage your diabetes.  Controlling your carbohydrate and sugar intake is recommended to manage diabetes.  Take any prescribed medicines to control diabetes as directed by your health care provider.  Control your hypertension.  Making food choices that are low in salt (sodium), saturated fat, trans fat, and cholesterol is recommended to manage hypertension.  Take any prescribed medicines to control hypertension as directed by your health care provider.  Maintain a healthy weight.  Reducing calorie intake and making food choices that are low in sodium, saturated fat, trans fat, and cholesterol are recommended to manage weight.  Stop drug abuse.  Avoid taking birth control pills.  Talk to your health care provider about the risks of taking birth control pills if you are over 35 years old, smoke, get migraines, or have ever had a blood clot.  Get evaluated for sleep  disorders (sleep apnea).  Talk to your health care provider about getting a sleep evaluation if you snore a lot or have excessive sleepiness.  Take medicines only as directed by your health care provider.  For some people, aspirin or blood thinners (anticoagulants) are helpful in reducing the risk of forming abnormal blood clots that can lead to stroke. If you have the irregular heart rhythm of atrial fibrillation, you should be on a blood thinner unless there is a good reason you cannot take them.  Understand all your medicine instructions.  Make sure that other conditions (such as anemia or atherosclerosis) are addressed. SEEK IMMEDIATE MEDICAL CARE IF:   You have sudden weakness or numbness of the face, arm, or leg, especially on one side of the body.  Your face or eyelid droops to one side.  You have sudden confusion.  You have trouble speaking (aphasia) or understanding.  You have sudden trouble seeing in one or both eyes.  You have sudden trouble walking.  You have dizziness.  You have a loss of balance or coordination.  You have a sudden, severe headache with no known cause.  You have new chest pain or an irregular heartbeat. Any of these symptoms may represent a serious problem that is an emergency. Do not wait to see if the symptoms will go away. Get medical help at once. Call your local emergency services (911 in [place] Do not drive yourself to the hospital. Document Released: 06/11/2004 Document Revised: 09/18/2013 Document Reviewed: 11/04/2012 [employer] Patient Information 2015 [employer]. This information is not intended to replace advice given   to you by your health care provider. Make sure you discuss any questions you have with your health care provider.  

## 2014-05-04 NOTE — Progress Notes (Signed)
Established Carotid Patient   History of Present Illness  Joshua York is a 80 y.o. male patient who returns today for follow up of his carotid artery stenosis. He was seen in our office in 2012 by Dr. Trula Slade. At that time the patient did not have significant extracranial carotid occlusive disease and Dr. Trula Slade did not not think this contributed to his stroke. The patient had 2 neurologic events in 2012, one in June and in early November. November, 2012 he presented to to the emergency department where he had an episode of confusion self urination and inability to pulled the zipper down on his pants. It lasted several minutes. A full workup in the hospital was done an MRA revealed left MCA (M1) occlusion. Carotid ultrasounds of bilateral atherosclerosis left greater than right but less than 50%. The patient was started on aspirin and Plavix and discharged to home. He did see the neurology stroke service while he was in the hospital. He has had no new symptoms. After reviewing his MRI Dr. Trula Slade felt that most likely the artery was occluded and that he would not benefit from intracranial intervention and that he would be best managed medically which would include antiplatelet therapy (aspirin and Plavix), blood glucose control, lipid management and blood pressure control.  No record of an echocardiogram was found at that time and Dr. Trula Slade suggested this be performed to make sure that there is no source for emboli.  Patient has not had previous carotid artery intervention.  Patient denies ever having a stroke, his neurologist, Dr. Jannifer Franklin, told him that he did not have a stroke, he did have an intracranial arterial occlusion. His PCP found that his sodium was depleted and his HCTZ was stopped. Patient states he has not had any further stoke or TIA symptoms after 2012. He walks a mile every other day.  The patient denies amaurosis fugax or monocular blindness. The patient denies facial  drooping. Pt. denies hemiplegia. The patient denies receptive or expressive aphasia. Pt. denies extremity weakness.  He rdenies New Medical or Surgical History.  Patient denies claudication symptoms, denies non-healing wounds.   Pt Diabetic: Yes, states in control Pt smoker: former smoker, quit in the 1980's  Pt meds include: Statin : Yes ASA: Yes Other anticoagulants/antiplatelets: Plavix   Past Medical History  Diagnosis Date  . Diabetes mellitus   . Hyperlipidemia   . Hypertension   . Chest pain   . Diastolic dysfunction   . Mild aortic sclerosis   . MR (mitral regurgitation)     MILD  . TR (tricuspid regurgitation)     MILD  . Glaucoma   . Hypercholesteremia   . Depression   . Peripheral vascular disease   . Stroke     History of TIA  . Inguinal hernia     LIH  . Carotid artery occlusion   . Anemia   . Cancer June 2015    skin cancer  BCC:  nose,chin    Social History History  Substance Use Topics  . Smoking status: Former Smoker    Types: Cigarettes    Quit date: 08/28/1978  . Smokeless tobacco: Never Used  . Alcohol Use: 3.6 oz/week    6 Shots of liquor per week    Family History Family History  Problem Relation Age of Onset  . Heart attack Father 23  . Hypertension Father   . Heart disease Father     Before age 7  . Other Mother 68  OLD AGE  . Diabetes Brother   . Heart disease Brother     After age 58  . Hypertension Brother   . Heart attack Brother   . Cancer Brother     lung    Surgical History Past Surgical History  Procedure Laterality Date  . Basal carcinoma reoved 6times within past 10years  1995 to 2005    6 basal cell carcinomas removed  . Hernia repair  1980    RIH  . Fracture surgery  1947    broken cheekbone  . Colonoscopy    . Inguinal hernia repair  06/16/2011    Procedure: HERNIA REPAIR INGUINAL ADULT;  Surgeon: Joyice Faster. Cornett, MD;  Location: [facility];  Service: General;  Laterality:  Left;  LEFT INGUINAL HERNIA REPAIR  . Eye surgery Left 04-17-13    Cataract    Allergies  Allergen Reactions  . Hctz [Hydrochlorothiazide]     Depletes sodium  . Iodinated Diagnostic Agents   . Procaine Hcl   . Shellfish-Derived Products     Current Outpatient Prescriptions  Medication Sig Dispense Refill  . amLODipine (NORVASC) 10 MG tablet Take 10 mg by mouth every morning.     Marland Kitchen aspirin 81 MG tablet Take 81 mg by mouth daily.      . cloNIDine (CATAPRES) 0.1 MG tablet Take by mouth every morning.  1  . clopidogrel (PLAVIX) 75 MG tablet Take 75 mg by mouth daily.      . Cyanocobalamin (VITAMIN B-12 IJ) Inject as directed every 30 (thirty) days.    Marland Kitchen glipiZIDE (GLUCOTROL) 10 MG tablet Take 10 mg by mouth every morning.     . latanoprost (XALATAN) 0.005 % ophthalmic solution Place 1 drop into both eyes at bedtime.     Marland Kitchen losartan (COZAAR) 100 MG tablet Take by mouth at bedtime.    . lovastatin (MEVACOR) 20 MG tablet Take 20 mg by mouth every evening.     . metFORMIN (GLUCOPHAGE) 500 MG tablet Take 500 mg by mouth 2 (two) times daily.     . pioglitazone (ACTOS) 30 MG tablet Take 30 mg by mouth every morning.     . olmesartan (BENICAR) 20 MG tablet Take 20 mg by mouth every morning.     . trandolapril (MAVIK) 4 MG tablet Take 4 mg by mouth every morning.     Marland Kitchen VIGAMOX 0.5 % ophthalmic solution Place into both eyes at bedtime.     No current facility-administered medications for this visit.    Review of Systems : See HPI for pertinent positives and negatives.  Physical Examination  Filed Vitals:   05/04/14 1616 05/04/14 1619  BP: 139/74 133/67  Pulse: 51 50  Resp:  16  Height:  5\' 8"  (1.727 m)  Weight:  165 lb (74.844 kg)  SpO2:  99%   Body mass index is 25.09 kg/(m^2).  General: WDWN male in NAD GAIT: normal Eyes: PERRLA Pulmonary: CTAB, Negative Rales, Negative rhonchi, & Negative wheezing.  Cardiac: regular Rhythm, no detected murmur.  VASCULAR EXAM Carotid  Bruits Left Right   Negative Negative    Radial pulses are 2+ palpable and equal. Aorta is not palpable.      LE Pulses LEFT RIGHT   POPLITEAL not palpable  not palpable       PT Not palpable Not palpable       DP palpable Not palpable    Gastrointestinal: soft, nontender, BS WNL, no r/g, no palpable masses.  Musculoskeletal:  Negative muscle atrophy/wasting. M/S 5/5 throughout, Extremities without ischemic changes.  Neurologic: A&O X 3; Appropriate Affect, sensation is normal;  Speech is normal, CN 2-12 intact except for right facial droop with smile, Pain and light touch intact in extremities, Motor exam as listed above.   Non-Invasive Vascular Imaging CAROTID DUPLEX 05/04/2014   CEREBROVASCULAR DUPLEX EVALUATION    INDICATION: Carotid stenosis    PREVIOUS INTERVENTION(S): None    DUPLEX EXAM:     RIGHT  LEFT  Peak Systolic Velocities (cm/s) End Diastolic Velocities (cm/s) Plaque LOCATION Peak Systolic Velocities (cm/s) End Diastolic Velocities (cm/s) Plaque  78 13 HT CCA PROXIMAL 84 12 HT  80 14 HT CCA MID 126 22 HT  71 15 HT CCA DISTAL 119 18 HT  132 12 HT ECA 88 10 HT  91 27 HT ICA PROXIMAL 74 16 HT  62 17  ICA MID 61 20   59 12  ICA DISTAL 56 18     1.2 ICA / CCA Ratio (PSV) .6  Antegreade Vertebral Flow Antegrade  720 Brachial Systolic Pressure (mmHg) 947  Triphasic Brachial Artery Waveforms Triphasic    Plaque Morphology:  HM = Homogeneous, HT = Heterogeneous, CP = Calcific Plaque, SP = Smooth Plaque, IP = Irregular Plaque  ADDITIONAL FINDINGS:     IMPRESSION: 1. Less than 40% diameter reduction noted in the right and left internal carotid arteries.   2. Irregular plaque noted bilateral common carotid arteries.  No significant velocities noted.      Compared to the previous exam:  No significant changes  noted when compared to previous exam of 04/24/2013.      Assessment: Joshua York is a 80 y.o. male who presents with asymptomatic minimal bilateral internal carotid artery stenosis; irregular plaque noted in bilateral common carotid arteries.  No significant velocities noted. No significant changes noted when compared to previous exam of 04/24/2013.  Plan: Follow-up in 1year with Carotid Duplex scan.   I discussed in depth with the patient the nature of atherosclerosis, and emphasized the importance of maximal medical management including strict control of blood pressure, blood glucose, and lipid levels, obtaining regular exercise, and continued cessation of smoking.  The patient is aware that without maximal medical management the underlying atherosclerotic disease process will progress, limiting the benefit of any interventions. The patient was given information about stroke prevention and what symptoms should prompt the patient to seek immediate medical care. Thank you for allowing [place] to participate in this patient's care.  Clemon Chambers, RN, MSN, FNP-C [facility] Office: [phone]  Clinic Physician: Scot Dock  05/04/2014 4:24 PM

## 2014-05-07 ENCOUNTER — Encounter: Payer: Self-pay | Admitting: Family

## 2014-05-07 NOTE — Addendum Note (Signed)
Addended by: Dorthula Rue L on: 05/07/2014 12:36 PM   Modules accepted: Orders

## 2014-05-21 DIAGNOSIS — D510 Vitamin B12 deficiency anemia due to intrinsic factor deficiency: Secondary | ICD-10-CM | POA: Diagnosis not present

## 2014-06-01 DIAGNOSIS — G459 Transient cerebral ischemic attack, unspecified: Secondary | ICD-10-CM | POA: Diagnosis not present

## 2014-06-01 DIAGNOSIS — D649 Anemia, unspecified: Secondary | ICD-10-CM | POA: Diagnosis not present

## 2014-06-01 DIAGNOSIS — L570 Actinic keratosis: Secondary | ICD-10-CM | POA: Diagnosis not present

## 2014-06-01 DIAGNOSIS — D510 Vitamin B12 deficiency anemia due to intrinsic factor deficiency: Secondary | ICD-10-CM | POA: Diagnosis not present

## 2014-06-01 DIAGNOSIS — E780 Pure hypercholesterolemia: Secondary | ICD-10-CM | POA: Diagnosis not present

## 2014-06-01 DIAGNOSIS — R8290 Unspecified abnormal findings in urine: Secondary | ICD-10-CM | POA: Diagnosis not present

## 2014-06-01 DIAGNOSIS — Z0000 Encounter for general adult medical examination without abnormal findings: Secondary | ICD-10-CM | POA: Diagnosis not present

## 2014-06-01 DIAGNOSIS — E1149 Type 2 diabetes mellitus with other diabetic neurological complication: Secondary | ICD-10-CM | POA: Diagnosis not present

## 2014-06-01 DIAGNOSIS — I10 Essential (primary) hypertension: Secondary | ICD-10-CM | POA: Diagnosis not present

## 2014-06-01 DIAGNOSIS — H4089 Other specified glaucoma: Secondary | ICD-10-CM | POA: Diagnosis not present

## 2014-06-05 ENCOUNTER — Other Ambulatory Visit: Payer: Self-pay | Admitting: Dermatology

## 2014-06-05 DIAGNOSIS — D485 Neoplasm of uncertain behavior of skin: Secondary | ICD-10-CM | POA: Diagnosis not present

## 2014-06-05 DIAGNOSIS — D230 Other benign neoplasm of skin of lip: Secondary | ICD-10-CM | POA: Diagnosis not present

## 2014-06-05 DIAGNOSIS — L821 Other seborrheic keratosis: Secondary | ICD-10-CM | POA: Diagnosis not present

## 2014-06-05 DIAGNOSIS — L570 Actinic keratosis: Secondary | ICD-10-CM | POA: Diagnosis not present

## 2014-06-05 DIAGNOSIS — C44319 Basal cell carcinoma of skin of other parts of face: Secondary | ICD-10-CM | POA: Diagnosis not present

## 2014-06-05 DIAGNOSIS — Z85828 Personal history of other malignant neoplasm of skin: Secondary | ICD-10-CM | POA: Diagnosis not present

## 2014-06-13 DIAGNOSIS — H4011X1 Primary open-angle glaucoma, mild stage: Secondary | ICD-10-CM | POA: Diagnosis not present

## 2014-06-13 DIAGNOSIS — Z961 Presence of intraocular lens: Secondary | ICD-10-CM | POA: Diagnosis not present

## 2014-06-21 DIAGNOSIS — D510 Vitamin B12 deficiency anemia due to intrinsic factor deficiency: Secondary | ICD-10-CM | POA: Diagnosis not present

## 2014-06-21 DIAGNOSIS — Z85828 Personal history of other malignant neoplasm of skin: Secondary | ICD-10-CM | POA: Diagnosis not present

## 2014-06-29 DIAGNOSIS — Z0000 Encounter for general adult medical examination without abnormal findings: Secondary | ICD-10-CM | POA: Diagnosis not present

## 2014-06-29 DIAGNOSIS — E1149 Type 2 diabetes mellitus with other diabetic neurological complication: Secondary | ICD-10-CM | POA: Diagnosis not present

## 2014-06-29 DIAGNOSIS — G459 Transient cerebral ischemic attack, unspecified: Secondary | ICD-10-CM | POA: Diagnosis not present

## 2014-06-29 DIAGNOSIS — R8290 Unspecified abnormal findings in urine: Secondary | ICD-10-CM | POA: Diagnosis not present

## 2014-06-29 DIAGNOSIS — D649 Anemia, unspecified: Secondary | ICD-10-CM | POA: Diagnosis not present

## 2014-06-29 DIAGNOSIS — L570 Actinic keratosis: Secondary | ICD-10-CM | POA: Diagnosis not present

## 2014-06-29 DIAGNOSIS — D510 Vitamin B12 deficiency anemia due to intrinsic factor deficiency: Secondary | ICD-10-CM | POA: Diagnosis not present

## 2014-06-29 DIAGNOSIS — E780 Pure hypercholesterolemia: Secondary | ICD-10-CM | POA: Diagnosis not present

## 2014-06-29 DIAGNOSIS — H4089 Other specified glaucoma: Secondary | ICD-10-CM | POA: Diagnosis not present

## 2014-06-29 DIAGNOSIS — I10 Essential (primary) hypertension: Secondary | ICD-10-CM | POA: Diagnosis not present

## 2014-07-20 DIAGNOSIS — D649 Anemia, unspecified: Secondary | ICD-10-CM | POA: Diagnosis not present

## 2014-07-25 DIAGNOSIS — D510 Vitamin B12 deficiency anemia due to intrinsic factor deficiency: Secondary | ICD-10-CM | POA: Diagnosis not present

## 2014-08-22 DIAGNOSIS — D510 Vitamin B12 deficiency anemia due to intrinsic factor deficiency: Secondary | ICD-10-CM | POA: Diagnosis not present

## 2014-09-20 DIAGNOSIS — D510 Vitamin B12 deficiency anemia due to intrinsic factor deficiency: Secondary | ICD-10-CM | POA: Diagnosis not present

## 2014-10-22 DIAGNOSIS — D510 Vitamin B12 deficiency anemia due to intrinsic factor deficiency: Secondary | ICD-10-CM | POA: Diagnosis not present

## 2014-11-21 DIAGNOSIS — D510 Vitamin B12 deficiency anemia due to intrinsic factor deficiency: Secondary | ICD-10-CM | POA: Diagnosis not present

## 2014-12-04 DIAGNOSIS — L565 Disseminated superficial actinic porokeratosis (DSAP): Secondary | ICD-10-CM | POA: Diagnosis not present

## 2014-12-04 DIAGNOSIS — L821 Other seborrheic keratosis: Secondary | ICD-10-CM | POA: Diagnosis not present

## 2014-12-04 DIAGNOSIS — Z85828 Personal history of other malignant neoplasm of skin: Secondary | ICD-10-CM | POA: Diagnosis not present

## 2014-12-04 DIAGNOSIS — D2372 Other benign neoplasm of skin of left lower limb, including hip: Secondary | ICD-10-CM | POA: Diagnosis not present

## 2014-12-04 DIAGNOSIS — D485 Neoplasm of uncertain behavior of skin: Secondary | ICD-10-CM | POA: Diagnosis not present

## 2014-12-04 DIAGNOSIS — L570 Actinic keratosis: Secondary | ICD-10-CM | POA: Diagnosis not present

## 2014-12-05 DIAGNOSIS — E1149 Type 2 diabetes mellitus with other diabetic neurological complication: Secondary | ICD-10-CM | POA: Diagnosis not present

## 2014-12-05 DIAGNOSIS — D649 Anemia, unspecified: Secondary | ICD-10-CM | POA: Diagnosis not present

## 2014-12-11 DIAGNOSIS — H4011X1 Primary open-angle glaucoma, mild stage: Secondary | ICD-10-CM | POA: Diagnosis not present

## 2014-12-21 DIAGNOSIS — D510 Vitamin B12 deficiency anemia due to intrinsic factor deficiency: Secondary | ICD-10-CM | POA: Diagnosis not present

## 2015-01-18 DIAGNOSIS — Z23 Encounter for immunization: Secondary | ICD-10-CM | POA: Diagnosis not present

## 2015-01-28 DIAGNOSIS — D510 Vitamin B12 deficiency anemia due to intrinsic factor deficiency: Secondary | ICD-10-CM | POA: Diagnosis not present

## 2015-02-27 DIAGNOSIS — D510 Vitamin B12 deficiency anemia due to intrinsic factor deficiency: Secondary | ICD-10-CM | POA: Diagnosis not present

## 2015-03-07 DIAGNOSIS — E1149 Type 2 diabetes mellitus with other diabetic neurological complication: Secondary | ICD-10-CM | POA: Diagnosis not present

## 2015-04-02 DIAGNOSIS — D510 Vitamin B12 deficiency anemia due to intrinsic factor deficiency: Secondary | ICD-10-CM | POA: Diagnosis not present

## 2015-05-01 ENCOUNTER — Encounter: Payer: Self-pay | Admitting: Family

## 2015-05-02 DIAGNOSIS — D510 Vitamin B12 deficiency anemia due to intrinsic factor deficiency: Secondary | ICD-10-CM | POA: Diagnosis not present

## 2015-05-06 ENCOUNTER — Ambulatory Visit (INDEPENDENT_AMBULATORY_CARE_PROVIDER_SITE_OTHER): Payer: Medicare Other | Admitting: Family

## 2015-05-06 ENCOUNTER — Other Ambulatory Visit (HOSPITAL_COMMUNITY): Payer: Medicare Other

## 2015-05-06 ENCOUNTER — Ambulatory Visit: Payer: Medicare Other | Admitting: Family

## 2015-05-06 ENCOUNTER — Encounter: Payer: Self-pay | Admitting: Family

## 2015-05-06 ENCOUNTER — Other Ambulatory Visit: Payer: Self-pay | Admitting: Family

## 2015-05-06 ENCOUNTER — Ambulatory Visit (HOSPITAL_COMMUNITY)
Admission: RE | Admit: 2015-05-06 | Discharge: 2015-05-06 | Disposition: A | Discharge status: Home / Self Care | Payer: Medicare Other | Source: Ambulatory Visit | Attending: Family | Admitting: Family

## 2015-05-06 VITALS — BP 155/72 | HR 56 | Temp 97.3°F | Resp 14 | Ht 68.25 in | Wt 165.0 lb

## 2015-05-06 DIAGNOSIS — I10 Essential (primary) hypertension: Secondary | ICD-10-CM | POA: Diagnosis not present

## 2015-05-06 DIAGNOSIS — E119 Type 2 diabetes mellitus without complications: Secondary | ICD-10-CM | POA: Insufficient documentation

## 2015-05-06 DIAGNOSIS — E785 Hyperlipidemia, unspecified: Secondary | ICD-10-CM | POA: Diagnosis not present

## 2015-05-06 DIAGNOSIS — I6523 Occlusion and stenosis of bilateral carotid arteries: Secondary | ICD-10-CM

## 2015-05-06 NOTE — Progress Notes (Signed)
Filed Vitals:   05/06/15 1522 05/06/15 1527 05/06/15 1541 05/06/15 1543  BP: 169/70 166/75 170/73 155/72  Pulse: 56 56 60 56  Temp:  97.3 F (36.3 C)    TempSrc:  Oral    Resp:  14    Height:  5' 8.25" (1.734 m)    Weight:  165 lb (74.844 kg)    SpO2:  100%

## 2015-05-06 NOTE — Patient Instructions (Signed)
Stroke Prevention Some medical conditions and behaviors are associated with an increased chance of having a stroke. You may prevent a stroke by making healthy choices and managing medical conditions. HOW CAN I REDUCE MY RISK OF HAVING A STROKE?   Stay physically active. Get at least 30 minutes of activity on most or all days.  Do not smoke. It may also be helpful to avoid exposure to secondhand smoke.  Limit alcohol use. Moderate alcohol use is considered to be:  No more than 2 drinks per day for men.  No more than 1 drink per day for nonpregnant women.  Eat healthy foods. This involves:  Eating 5 or more servings of fruits and vegetables a day.  Making dietary changes that address high blood pressure (hypertension), high cholesterol, diabetes, or obesity.  Manage your cholesterol levels.  Making food choices that are high in fiber and low in saturated fat, trans fat, and cholesterol may control cholesterol levels.  Take any prescribed medicines to control cholesterol as directed by your health care provider.  Manage your diabetes.  Controlling your carbohydrate and sugar intake is recommended to manage diabetes.  Take any prescribed medicines to control diabetes as directed by your health care provider.  Control your hypertension.  Making food choices that are low in salt (sodium), saturated fat, trans fat, and cholesterol is recommended to manage hypertension.  Ask your health care provider if you need treatment to lower your blood pressure. Take any prescribed medicines to control hypertension as directed by your health care provider.  If you are 18-39 years of age, have your blood pressure checked every 3-5 years. If you are 40 years of age or older, have your blood pressure checked every year.  Maintain a healthy weight.  Reducing calorie intake and making food choices that are low in sodium, saturated fat, trans fat, and cholesterol are recommended to manage  weight.  Stop drug abuse.  Avoid taking birth control pills.  Talk to your health care provider about the risks of taking birth control pills if you are over 35 years old, smoke, get migraines, or have ever had a blood clot.  Get evaluated for sleep disorders (sleep apnea).  Talk to your health care provider about getting a sleep evaluation if you snore a lot or have excessive sleepiness.  Take medicines only as directed by your health care provider.  For some people, aspirin or blood thinners (anticoagulants) are helpful in reducing the risk of forming abnormal blood clots that can lead to stroke. If you have the irregular heart rhythm of atrial fibrillation, you should be on a blood thinner unless there is a good reason you cannot take them.  Understand all your medicine instructions.  Make sure that other conditions (such as anemia or atherosclerosis) are addressed. SEEK IMMEDIATE MEDICAL CARE IF:   You have sudden weakness or numbness of the face, arm, or leg, especially on one side of the body.  Your face or eyelid droops to one side.  You have sudden confusion.  You have trouble speaking (aphasia) or understanding.  You have sudden trouble seeing in one or both eyes.  You have sudden trouble walking.  You have dizziness.  You have a loss of balance or coordination.  You have a sudden, severe headache with no known cause.  You have new chest pain or an irregular heartbeat. Any of these symptoms may represent a serious problem that is an emergency. Do not wait to see if the symptoms will   go away. Get medical help at once. Call your local emergency services (911 in [place] Do not drive yourself to the hospital.   This information is not intended to replace advice given to you by your health care provider. Make sure you discuss any questions you have with your health care provider.   Document Released: 06/11/2004 Document Revised: 05/25/2014 Document Reviewed:  11/04/2012 Elsevier Interactive Patient Education 2016 Elsevier Inc.  

## 2015-05-06 NOTE — Progress Notes (Signed)
Chief Complaint: Extracranial Carotid Artery Stenosis   History of Present Illness  Joshua York is a 81 y.o. male patient who returns today for follow up of his carotid artery stenosis. He was seen in our office in 2012 by Dr. Trula Slade. At that time the patient did not have significant extracranial carotid occlusive disease and Dr. Trula Slade did not not think this contributed to his stroke. The patient had 2 neurologic events in 2012, one in June and in early November. November, 2012 he presented to to the emergency department where he had an episode of confusion self urination and inability to pulled the zipper down on his pants. It lasted several minutes. A full workup in the hospital was done an MRA revealed left MCA (M1) occlusion. Carotid ultrasounds of bilateral atherosclerosis left greater than right but less than 50%. The patient was started on aspirin and Plavix and discharged to home. He did see the neurology stroke service while he was in the hospital. He has had no new symptoms. After reviewing his MRI Dr. Trula Slade felt that most likely the artery was occluded and that he would not benefit from intracranial intervention and that he would be best managed medically which would include antiplatelet therapy (aspirin and Plavix), blood glucose control, lipid management and blood pressure control.  No record of an echocardiogram was found at that time and Dr. Trula Slade suggested this be performed to make sure that there is no source for emboli.  Patient has not had previous carotid artery intervention.  Patient denies ever having a stroke, his neurologist, Dr. Jannifer Franklin, told him that he did not have a stroke, he did have an intracranial arterial occlusion. His PCP found that his sodium was depleted and his HCTZ was stopped. Patient states he has not had any further stoke or TIA symptoms after 2012. He walks a half mile every day.  He states his blood pressure at home runs about 135/60;  states he got agitated today as his car would not start; his blood pressure increases with agitation.   The patient denies a history amaurosis fugax or monocular blindness, unilateral facial drooping, hemiplegia, or receptive or expressive aphasia. Pt. denies extremity weakness.  He rdenies New Medical or Surgical History.  Patient denies claudication symptoms, denies non-healing wounds.   Pt Diabetic: Yes, states in control Pt smoker: former smoker, quit in the 1980's  Pt meds include: Statin : Yes ASA: no Other anticoagulants/antiplatelets: Plavix   Past Medical History  Diagnosis Date  . Diabetes mellitus   . Hyperlipidemia   . Hypertension   . Chest pain   . Diastolic dysfunction   . Mild aortic sclerosis ([place])   . MR (mitral regurgitation)     MILD  . TR (tricuspid regurgitation)     MILD  . Glaucoma   . Hypercholesteremia   . Depression   . Peripheral vascular disease ([place])   . Stroke [facility])     History of TIA  . Inguinal hernia     LIH  . Carotid artery occlusion   . Anemia   . Cancer [facility]) June 2015    skin cancer  BCC:  nose,chin    Social History Social History  Substance Use Topics  . Smoking status: Former Smoker    Types: Cigarettes    Quit date: 08/28/1978  . Smokeless tobacco: Never Used  . Alcohol Use: 3.6 oz/week    6 Shots of liquor per week    Family History Family History  Problem  Relation Age of Onset  . Heart attack Father 22  . Hypertension Father   . Heart disease Father     Before age 53  . Other Mother 94    OLD AGE  . Diabetes Brother   . Heart disease Brother     After age 77  . Hypertension Brother   . Heart attack Brother   . Cancer Brother     lung    Surgical History Past Surgical History  Procedure Laterality Date  . Basal carcinoma reoved 6times within past 10years  1995 to 2005    6 basal cell carcinomas removed  . Hernia repair  1980    RIH  . Fracture surgery  1947    broken cheekbone  . Colonoscopy     . Inguinal hernia repair  06/16/2011    Procedure: HERNIA REPAIR INGUINAL ADULT;  Surgeon: Joyice Faster. Cornett, MD;  Location: [facility];  Service: General;  Laterality: Left;  LEFT INGUINAL HERNIA REPAIR  . Eye surgery Left 04-17-13    Cataract    Allergies  Allergen Reactions  . Hctz [Hydrochlorothiazide]     Depletes sodium  . Iodinated Diagnostic Agents   . Procaine Hcl   . Shellfish-Derived Products     Current Outpatient Prescriptions  Medication Sig Dispense Refill  . glimepiride (AMARYL) 2 MG tablet every morning.  0  . metFORMIN (GLUCOPHAGE) 500 MG tablet Take 500 mg by mouth 2 (two) times daily.     Marland Kitchen omeprazole (PRILOSEC) 40 MG capsule Take 40 mg by mouth daily.  0  . pioglitazone (ACTOS) 30 MG tablet Take 30 mg by mouth every morning.     Marland Kitchen amLODipine (NORVASC) 10 MG tablet Take 10 mg by mouth every morning.     Marland Kitchen aspirin 81 MG tablet Take 81 mg by mouth daily.      . cloNIDine (CATAPRES) 0.1 MG tablet Take by mouth every morning.  1  . clopidogrel (PLAVIX) 75 MG tablet Take 75 mg by mouth daily.      . Cyanocobalamin (VITAMIN B-12 IJ) Inject as directed every 30 (thirty) days.    Marland Kitchen glipiZIDE (GLUCOTROL) 10 MG tablet Take 10 mg by mouth every morning.     . latanoprost (XALATAN) 0.005 % ophthalmic solution Place 1 drop into both eyes at bedtime.     Marland Kitchen losartan (COZAAR) 100 MG tablet Take by mouth at bedtime.    . lovastatin (MEVACOR) 20 MG tablet Take 20 mg by mouth every evening.     . olmesartan (BENICAR) 20 MG tablet Take 20 mg by mouth every morning.     . trandolapril (MAVIK) 4 MG tablet Take 4 mg by mouth every morning.     Marland Kitchen VIGAMOX 0.5 % ophthalmic solution Place into both eyes at bedtime.     No current facility-administered medications for this visit.    Review of Systems : See HPI for pertinent positives and negatives.  Physical Examination  Filed Vitals:   05/06/15 1522 05/06/15 1527  BP: 169/70 166/75  Pulse: 56 56  Temp:  97.3 F  (36.3 C)  TempSrc:  Oral  Resp:  14  Height:  5' 8.25" (1.734 m)  Weight:  165 lb (74.844 kg)  SpO2:  100%   Body mass index is 24.89 kg/(m^2).  General: WDWN male in NAD GAIT: normal Eyes: PERRLA Pulmonary: CTAB, Negative Rales, Negative rhonchi, & Negative wheezing.  Cardiac: regular Rhythm, no detected murmur.  VASCULAR EXAM Carotid Bruits Left  Right   Negative Negative    Radial pulses are 2+ palpable and equal. Aorta is not palpable.      LE Pulses LEFT RIGHT   POPLITEAL not palpable  not palpable   PT Not palpable Not palpable   DP palpable Not palpable    Gastrointestinal: soft, nontender, BS WNL, no r/g, no palpable masses.  Musculoskeletal: Negative muscle atrophy/wasting. M/S 5/5 throughout, Extremities without ischemic changes.  Neurologic: A&O X 3; Appropriate Affect, sensation is normal;  Speech is normal, CN 2-12 intact except for right facial droop with smile, Pain and light touch intact in extremities, Motor exam as listed above.            Non-Invasive Vascular Imaging CAROTID DUPLEX 05/06/2015   CEREBROVASCULAR DUPLEX EVALUATION    INDICATION: Carotid artery stenosis    PREVIOUS INTERVENTION(S):     DUPLEX EXAM:     RIGHT  LEFT  Peak Systolic Velocities (cm/s) End Diastolic Velocities (cm/s) Plaque LOCATION Peak Systolic Velocities (cm/s) End Diastolic Velocities (cm/s) Plaque  79 13  CCA PROXIMAL 82 9   72 14 HT CCA MID 78 15   66 13 HT CCA DISTAL 72 14 HT  144 8 HT ECA 72 7 HT  74 21 HT ICA PROXIMAL 56 16 HT  79 15  ICA MID 76 20   103 22  ICA DISTAL 47 13     1.03 ICA / CCA Ratio (PSV) 0.72  Antegrade Vertebral Flow Antegrade  NA Brachial Systolic Pressure (mmHg) NA  NA Brachial Artery Waveforms NA    Plaque Morphology:  HM = Homogeneous, HT =  Heterogeneous, CP = Calcific Plaque, SP = Smooth Plaque, IP = Irregular Plaque  ADDITIONAL FINDINGS: Right subclavian artery PSV119cm/sec; Left subclavian artery PSV121cm/sec    IMPRESSION: Bilateral internal carotid artery stenosis present in the less than 40% range.    Compared to the previous exam:  Essentially unchanged since previous study on 05/04/2014.      Assessment: OVAL LITTREL is a 81 y.o. male who had 2 neurological events in 2012 as described in HPI, no residual neurological deficits, no subsequent neurological events. Today's carotid duplex suggests <40% bilateral ICA stenosis, bilateral vertebral artery is antegrade. Essentially unchanged since previous study on 05/04/2014.  If his carotid duplex remains unchanged in a year, at that point will stretch his carotid artery surveillance to every 2 years.  I advised the pt to see his PCP as soon as possible re his elevated blood pressure.   Plan: Follow-up in 1 year with Carotid Duplex scan.   I discussed in depth with the patient the nature of atherosclerosis, and emphasized the importance of maximal medical management including strict control of blood pressure, blood glucose, and lipid levels, obtaining regular exercise, and continued cessation of smoking.  The patient is aware that without maximal medical management the underlying atherosclerotic disease process will progress, limiting the benefit of any interventions. The patient was given information about stroke prevention and what symptoms should prompt the patient to seek immediate medical care. Thank you for allowing [place] to participate in this patient's care.  Clemon Chambers, RN, MSN, FNP-C [facility] Office: [phone]  Clinic Physician: Early on call  05/06/2015 3:37 PM

## 2015-06-03 DIAGNOSIS — D510 Vitamin B12 deficiency anemia due to intrinsic factor deficiency: Secondary | ICD-10-CM | POA: Diagnosis not present

## 2015-06-11 DIAGNOSIS — Z85828 Personal history of other malignant neoplasm of skin: Secondary | ICD-10-CM | POA: Diagnosis not present

## 2015-06-11 DIAGNOSIS — L821 Other seborrheic keratosis: Secondary | ICD-10-CM | POA: Diagnosis not present

## 2015-06-11 DIAGNOSIS — D485 Neoplasm of uncertain behavior of skin: Secondary | ICD-10-CM | POA: Diagnosis not present

## 2015-06-11 DIAGNOSIS — C44219 Basal cell carcinoma of skin of left ear and external auricular canal: Secondary | ICD-10-CM | POA: Diagnosis not present

## 2015-06-11 DIAGNOSIS — L570 Actinic keratosis: Secondary | ICD-10-CM | POA: Diagnosis not present

## 2015-06-13 DIAGNOSIS — D649 Anemia, unspecified: Secondary | ICD-10-CM | POA: Diagnosis not present

## 2015-06-13 DIAGNOSIS — I10 Essential (primary) hypertension: Secondary | ICD-10-CM | POA: Diagnosis not present

## 2015-06-13 DIAGNOSIS — R8290 Unspecified abnormal findings in urine: Secondary | ICD-10-CM | POA: Diagnosis not present

## 2015-06-13 DIAGNOSIS — D510 Vitamin B12 deficiency anemia due to intrinsic factor deficiency: Secondary | ICD-10-CM | POA: Diagnosis not present

## 2015-06-13 DIAGNOSIS — G459 Transient cerebral ischemic attack, unspecified: Secondary | ICD-10-CM | POA: Diagnosis not present

## 2015-06-13 DIAGNOSIS — E7800 Pure hypercholesterolemia, unspecified: Secondary | ICD-10-CM | POA: Diagnosis not present

## 2015-06-13 DIAGNOSIS — H4089 Other specified glaucoma: Secondary | ICD-10-CM | POA: Diagnosis not present

## 2015-06-13 DIAGNOSIS — E1149 Type 2 diabetes mellitus with other diabetic neurological complication: Secondary | ICD-10-CM | POA: Diagnosis not present

## 2015-06-13 DIAGNOSIS — Z0000 Encounter for general adult medical examination without abnormal findings: Secondary | ICD-10-CM | POA: Diagnosis not present

## 2015-06-18 DIAGNOSIS — Z0100 Encounter for examination of eyes and vision without abnormal findings: Secondary | ICD-10-CM | POA: Diagnosis not present

## 2015-06-18 DIAGNOSIS — E119 Type 2 diabetes mellitus without complications: Secondary | ICD-10-CM | POA: Diagnosis not present

## 2015-06-18 DIAGNOSIS — Z961 Presence of intraocular lens: Secondary | ICD-10-CM | POA: Diagnosis not present

## 2015-06-18 DIAGNOSIS — H401131 Primary open-angle glaucoma, bilateral, mild stage: Secondary | ICD-10-CM | POA: Diagnosis not present

## 2015-07-03 DIAGNOSIS — D510 Vitamin B12 deficiency anemia due to intrinsic factor deficiency: Secondary | ICD-10-CM | POA: Diagnosis not present

## 2015-07-31 DIAGNOSIS — D510 Vitamin B12 deficiency anemia due to intrinsic factor deficiency: Secondary | ICD-10-CM | POA: Diagnosis not present

## 2015-09-02 DIAGNOSIS — D510 Vitamin B12 deficiency anemia due to intrinsic factor deficiency: Secondary | ICD-10-CM | POA: Diagnosis not present

## 2015-09-30 DIAGNOSIS — D510 Vitamin B12 deficiency anemia due to intrinsic factor deficiency: Secondary | ICD-10-CM | POA: Diagnosis not present

## 2015-10-31 DIAGNOSIS — D510 Vitamin B12 deficiency anemia due to intrinsic factor deficiency: Secondary | ICD-10-CM | POA: Diagnosis not present

## 2015-11-29 DIAGNOSIS — D510 Vitamin B12 deficiency anemia due to intrinsic factor deficiency: Secondary | ICD-10-CM | POA: Diagnosis not present

## 2015-12-09 DIAGNOSIS — L821 Other seborrheic keratosis: Secondary | ICD-10-CM | POA: Diagnosis not present

## 2015-12-09 DIAGNOSIS — C44319 Basal cell carcinoma of skin of other parts of face: Secondary | ICD-10-CM | POA: Diagnosis not present

## 2015-12-09 DIAGNOSIS — D485 Neoplasm of uncertain behavior of skin: Secondary | ICD-10-CM | POA: Diagnosis not present

## 2015-12-09 DIAGNOSIS — Z85828 Personal history of other malignant neoplasm of skin: Secondary | ICD-10-CM | POA: Diagnosis not present

## 2015-12-09 DIAGNOSIS — L570 Actinic keratosis: Secondary | ICD-10-CM | POA: Diagnosis not present

## 2015-12-13 DIAGNOSIS — Z7984 Long term (current) use of oral hypoglycemic drugs: Secondary | ICD-10-CM | POA: Diagnosis not present

## 2015-12-13 DIAGNOSIS — H4089 Other specified glaucoma: Secondary | ICD-10-CM | POA: Diagnosis not present

## 2015-12-13 DIAGNOSIS — D649 Anemia, unspecified: Secondary | ICD-10-CM | POA: Diagnosis not present

## 2015-12-13 DIAGNOSIS — E1149 Type 2 diabetes mellitus with other diabetic neurological complication: Secondary | ICD-10-CM | POA: Diagnosis not present

## 2015-12-13 DIAGNOSIS — R8290 Unspecified abnormal findings in urine: Secondary | ICD-10-CM | POA: Diagnosis not present

## 2015-12-13 DIAGNOSIS — D510 Vitamin B12 deficiency anemia due to intrinsic factor deficiency: Secondary | ICD-10-CM | POA: Diagnosis not present

## 2015-12-13 DIAGNOSIS — G459 Transient cerebral ischemic attack, unspecified: Secondary | ICD-10-CM | POA: Diagnosis not present

## 2015-12-13 DIAGNOSIS — Z0000 Encounter for general adult medical examination without abnormal findings: Secondary | ICD-10-CM | POA: Diagnosis not present

## 2015-12-13 DIAGNOSIS — E7800 Pure hypercholesterolemia, unspecified: Secondary | ICD-10-CM | POA: Diagnosis not present

## 2015-12-13 DIAGNOSIS — I10 Essential (primary) hypertension: Secondary | ICD-10-CM | POA: Diagnosis not present

## 2015-12-31 DIAGNOSIS — D510 Vitamin B12 deficiency anemia due to intrinsic factor deficiency: Secondary | ICD-10-CM | POA: Diagnosis not present

## 2015-12-31 DIAGNOSIS — H401111 Primary open-angle glaucoma, right eye, mild stage: Secondary | ICD-10-CM | POA: Diagnosis not present

## 2015-12-31 DIAGNOSIS — E119 Type 2 diabetes mellitus without complications: Secondary | ICD-10-CM | POA: Diagnosis not present

## 2015-12-31 DIAGNOSIS — H401122 Primary open-angle glaucoma, left eye, moderate stage: Secondary | ICD-10-CM | POA: Diagnosis not present

## 2016-01-31 DIAGNOSIS — E538 Deficiency of other specified B group vitamins: Secondary | ICD-10-CM | POA: Diagnosis not present

## 2016-02-07 DIAGNOSIS — Z23 Encounter for immunization: Secondary | ICD-10-CM | POA: Diagnosis not present

## 2016-03-02 DIAGNOSIS — D510 Vitamin B12 deficiency anemia due to intrinsic factor deficiency: Secondary | ICD-10-CM | POA: Diagnosis not present

## 2016-03-04 DIAGNOSIS — H401131 Primary open-angle glaucoma, bilateral, mild stage: Secondary | ICD-10-CM | POA: Diagnosis not present

## 2016-04-01 DIAGNOSIS — D510 Vitamin B12 deficiency anemia due to intrinsic factor deficiency: Secondary | ICD-10-CM | POA: Diagnosis not present

## 2016-04-30 ENCOUNTER — Encounter: Payer: Self-pay | Admitting: Family

## 2016-04-30 DIAGNOSIS — D510 Vitamin B12 deficiency anemia due to intrinsic factor deficiency: Secondary | ICD-10-CM | POA: Diagnosis not present

## 2016-05-05 DIAGNOSIS — H401111 Primary open-angle glaucoma, right eye, mild stage: Secondary | ICD-10-CM | POA: Diagnosis not present

## 2016-05-05 DIAGNOSIS — H401122 Primary open-angle glaucoma, left eye, moderate stage: Secondary | ICD-10-CM | POA: Diagnosis not present

## 2016-05-07 ENCOUNTER — Ambulatory Visit (INDEPENDENT_AMBULATORY_CARE_PROVIDER_SITE_OTHER): Payer: Medicare Other | Admitting: Vascular Surgery

## 2016-05-07 ENCOUNTER — Ambulatory Visit: Payer: Medicare Other | Admitting: Family

## 2016-05-07 ENCOUNTER — Ambulatory Visit (HOSPITAL_COMMUNITY)
Admission: RE | Admit: 2016-05-07 | Discharge: 2016-05-07 | Disposition: A | Discharge status: Home / Self Care | Payer: Medicare Other | Source: Ambulatory Visit | Attending: Vascular Surgery | Admitting: Vascular Surgery

## 2016-05-07 VITALS — BP 148/67 | HR 78 | Temp 98.2°F | Resp 16 | Ht 68.0 in | Wt 167.0 lb

## 2016-05-07 DIAGNOSIS — I6523 Occlusion and stenosis of bilateral carotid arteries: Secondary | ICD-10-CM | POA: Insufficient documentation

## 2016-05-07 LAB — VAS US CAROTID
LCCADDIAS: -14 cm/s
LCCAPSYS: 84 cm/s
LEFT ECA DIAS: 0 cm/s
LEFT VERTEBRAL DIAS: -9 cm/s
LICADDIAS: -22 cm/s
Left CCA dist sys: -101 cm/s
Left CCA prox dias: 9 cm/s
Left ICA dist sys: -86 cm/s
Left ICA prox dias: -12 cm/s
Left ICA prox sys: -105 cm/s
RCCADSYS: -104 cm/s
RIGHT CCA MID DIAS: 12 cm/s
RIGHT ECA DIAS: -13 cm/s
RIGHT VERTEBRAL DIAS: 9 cm/s
Right CCA prox dias: 11 cm/s
Right CCA prox sys: 67 cm/s

## 2016-05-07 NOTE — Progress Notes (Signed)
[facility]  Patient name: Joshua York MRN: [id number] DOB: [birth year] Sex: male  REASON FOR VISIT: Follow-up   HPI: MORICE KUCZKOWSKI is a 82 y.o. male presents for continued follow-up of his carotid artery disease. He shouldn't had to neurologic events and 2012. Stroke workup was negative. His symptoms were secondary to hyponatremia from a drug interaction. He denies any episodes of amaurosis fugax, sudden onset weakness or numbness of the extremities and slurred speech. He denies any changes in his medical history since his last office visit one year ago.  He was previously on aspirin but this was discontinued by his PCP year ago. He is on Plavix. He is on a statin for hyperlipidemia. He is on oral hypoglycemics for diabetes. He is not a smoker. He is active and walks daily. He is able to complete his activities of daily living without issues.   Past Medical History:  Diagnosis Date  . Anemia   . Cancer [facility]) June 2015   skin cancer  BCC:  nose,chin  . Carotid artery occlusion   . Chest pain   . Depression   . Diabetes mellitus   . Diastolic dysfunction   . Glaucoma   . Hypercholesteremia   . Hyperlipidemia   . Hypertension   . Inguinal hernia    LIH  . Mild aortic sclerosis ([place])   . MR (mitral regurgitation)    MILD  . Peripheral vascular disease ([place])   . Stroke [facility])    History of TIA  . TR (tricuspid regurgitation)    MILD    Family History  Problem Relation Age of Onset  . Heart attack Father 67  . Hypertension Father   . Heart disease Father     Before age 63  . Other Mother 49    OLD AGE  . Diabetes Brother   . Heart disease Brother     After age 45  . Hypertension Brother   . Heart attack Brother   . Cancer Brother     lung    SOCIAL HISTORY: Social History  Substance Use Topics  . Smoking status: Former Smoker    Types: Cigarettes    Quit date: 08/28/1978  . Smokeless tobacco: Never Used  . Alcohol use 3.6  oz/week    6 Shots of liquor per week    Allergies  Allergen Reactions  . Hctz [Hydrochlorothiazide]     Depletes sodium  . Iodinated Diagnostic Agents   . Shellfish-Derived Products     Current Outpatient Prescriptions  Medication Sig Dispense Refill  . amLODipine (NORVASC) 10 MG tablet Take 10 mg by mouth every morning.     . cloNIDine (CATAPRES) 0.1 MG tablet Take by mouth every morning.  1  . clopidogrel (PLAVIX) 75 MG tablet Take 75 mg by mouth daily.      . Cyanocobalamin (VITAMIN B-12 IJ) Inject as directed every 30 (thirty) days.    Marland Kitchen glimepiride (AMARYL) 2 MG tablet every morning.  0  . glipiZIDE (GLUCOTROL) 10 MG tablet Take 10 mg by mouth every morning. Reported on 05/06/2015    . losartan (COZAAR) 100 MG tablet Take by mouth at bedtime.    . lovastatin (MEVACOR) 20 MG tablet Take 20 mg by mouth every evening.     . metFORMIN (GLUCOPHAGE) 500 MG tablet Take 500 mg by mouth 2 (two) times daily.     Marland Kitchen omeprazole (PRILOSEC) 40 MG capsule Take 40 mg by mouth daily.  0  . pioglitazone (ACTOS) 30 MG tablet Take 30 mg by mouth every morning.      No current facility-administered medications for this visit.     REVIEW OF SYSTEMS:  [X]  denotes positive finding, [ ]  denotes negative finding Cardiac  Comments:  Chest pain or chest pressure:    Shortness of breath upon exertion:    Short of breath when lying flat:    Irregular heart rhythm:        Vascular    Pain in calf, thigh, or hip brought on by ambulation:    Pain in feet at night that wakes you up from your sleep:     Blood clot in your veins:    Leg swelling:         Pulmonary    Oxygen at home:    Productive cough:     Wheezing:         Neurologic    Sudden weakness in arms or legs:     Sudden numbness in arms or legs:     Sudden onset of difficulty speaking or slurred speech:    Temporary loss of vision in one eye:     Problems with dizziness:         Gastrointestinal    Blood in stool:     Vomited  blood:         Genitourinary    Burning when urinating:     Blood in urine:        Psychiatric    Major depression:         Hematologic    Bleeding problems:    Problems with blood clotting too easily:        Skin    Rashes or ulcers:        Constitutional    Fever or chills:      PHYSICAL EXAM: Vitals:   05/07/16 1534 05/07/16 1536  BP: (!) 143/67 (!) 148/67  Pulse: 78   Resp: 16   Temp: 98.2 F (36.8 C)   TempSrc: Oral   SpO2: 95%   Weight: 167 lb (75.8 kg)   Height: 5\' 8"  (1.727 m)     GENERAL: The patient is a well-nourished male, in no acute distress. The vital signs are documented above. CARDIAC: There is a regular rate and rhythm. No carotid bruits. VASCULAR: 2+ radial pulses equal and symmetric. PULMONARY: There is good air exchange bilaterally without wheezing or rales. ABDOMEN: Soft and non-tender with normal pitched bowel sounds.  MUSCULOSKELETAL: There are no major deformities or cyanosis. NEUROLOGIC: No focal deficits. 5 out of 5 strength upper and lower extremity bilaterally. SKIN: There are no ulcers or rashes noted. PSYCHIATRIC: The patient has a normal affect.  DATA:  Carotid duplex 05/07/2016  Less than 40% bilateral internal carotid artery stenosis. Vertebral arteries are patent with antegrade flow bilaterally  MEDICAL ISSUES: Bilateral internal carotid artery stenosis  The patient's carotid duplex studies have been stable since his studies 1 year ago. He denies any TIA or stroke symptoms. He is on ask for medical management with Plavix and a statin. Since his results have been stable, we will stretch out his follow-up to 2 years with repeat carotid duplex. He knows to call us and/or seek emergency assistance if he develops any neurologic symptoms.    Virgina Jock, PA-C [facility] [place]

## 2016-05-08 ENCOUNTER — Other Ambulatory Visit: Payer: Self-pay

## 2016-05-08 DIAGNOSIS — I6529 Occlusion and stenosis of unspecified carotid artery: Secondary | ICD-10-CM

## 2016-06-01 DIAGNOSIS — D510 Vitamin B12 deficiency anemia due to intrinsic factor deficiency: Secondary | ICD-10-CM | POA: Diagnosis not present

## 2016-06-16 DIAGNOSIS — L570 Actinic keratosis: Secondary | ICD-10-CM | POA: Diagnosis not present

## 2016-06-16 DIAGNOSIS — C44319 Basal cell carcinoma of skin of other parts of face: Secondary | ICD-10-CM | POA: Diagnosis not present

## 2016-06-16 DIAGNOSIS — Z85828 Personal history of other malignant neoplasm of skin: Secondary | ICD-10-CM | POA: Diagnosis not present

## 2016-06-16 DIAGNOSIS — L821 Other seborrheic keratosis: Secondary | ICD-10-CM | POA: Diagnosis not present

## 2016-06-16 DIAGNOSIS — D485 Neoplasm of uncertain behavior of skin: Secondary | ICD-10-CM | POA: Diagnosis not present

## 2016-06-16 DIAGNOSIS — C4401 Basal cell carcinoma of skin of lip: Secondary | ICD-10-CM | POA: Diagnosis not present

## 2016-06-23 DIAGNOSIS — C44319 Basal cell carcinoma of skin of other parts of face: Secondary | ICD-10-CM | POA: Diagnosis not present

## 2016-06-23 DIAGNOSIS — Z85828 Personal history of other malignant neoplasm of skin: Secondary | ICD-10-CM | POA: Diagnosis not present

## 2016-07-02 DIAGNOSIS — E7800 Pure hypercholesterolemia, unspecified: Secondary | ICD-10-CM | POA: Diagnosis not present

## 2016-07-02 DIAGNOSIS — Z7984 Long term (current) use of oral hypoglycemic drugs: Secondary | ICD-10-CM | POA: Diagnosis not present

## 2016-07-02 DIAGNOSIS — D510 Vitamin B12 deficiency anemia due to intrinsic factor deficiency: Secondary | ICD-10-CM | POA: Diagnosis not present

## 2016-07-02 DIAGNOSIS — Z8673 Personal history of transient ischemic attack (TIA), and cerebral infarction without residual deficits: Secondary | ICD-10-CM | POA: Diagnosis not present

## 2016-07-02 DIAGNOSIS — R8290 Unspecified abnormal findings in urine: Secondary | ICD-10-CM | POA: Diagnosis not present

## 2016-07-02 DIAGNOSIS — H4089 Other specified glaucoma: Secondary | ICD-10-CM | POA: Diagnosis not present

## 2016-07-02 DIAGNOSIS — I10 Essential (primary) hypertension: Secondary | ICD-10-CM | POA: Diagnosis not present

## 2016-07-02 DIAGNOSIS — Z0000 Encounter for general adult medical examination without abnormal findings: Secondary | ICD-10-CM | POA: Diagnosis not present

## 2016-07-02 DIAGNOSIS — L570 Actinic keratosis: Secondary | ICD-10-CM | POA: Diagnosis not present

## 2016-07-02 DIAGNOSIS — E1149 Type 2 diabetes mellitus with other diabetic neurological complication: Secondary | ICD-10-CM | POA: Diagnosis not present

## 2016-07-30 DIAGNOSIS — D510 Vitamin B12 deficiency anemia due to intrinsic factor deficiency: Secondary | ICD-10-CM | POA: Diagnosis not present

## 2016-08-31 DIAGNOSIS — D510 Vitamin B12 deficiency anemia due to intrinsic factor deficiency: Secondary | ICD-10-CM | POA: Diagnosis not present

## 2016-09-30 DIAGNOSIS — D510 Vitamin B12 deficiency anemia due to intrinsic factor deficiency: Secondary | ICD-10-CM | POA: Diagnosis not present

## 2016-10-30 DIAGNOSIS — D510 Vitamin B12 deficiency anemia due to intrinsic factor deficiency: Secondary | ICD-10-CM | POA: Diagnosis not present

## 2016-11-10 DIAGNOSIS — Z961 Presence of intraocular lens: Secondary | ICD-10-CM | POA: Diagnosis not present

## 2016-11-10 DIAGNOSIS — H401131 Primary open-angle glaucoma, bilateral, mild stage: Secondary | ICD-10-CM | POA: Diagnosis not present

## 2016-11-30 DIAGNOSIS — D510 Vitamin B12 deficiency anemia due to intrinsic factor deficiency: Secondary | ICD-10-CM | POA: Diagnosis not present

## 2016-12-15 DIAGNOSIS — D485 Neoplasm of uncertain behavior of skin: Secondary | ICD-10-CM | POA: Diagnosis not present

## 2016-12-15 DIAGNOSIS — L570 Actinic keratosis: Secondary | ICD-10-CM | POA: Diagnosis not present

## 2016-12-15 DIAGNOSIS — L821 Other seborrheic keratosis: Secondary | ICD-10-CM | POA: Diagnosis not present

## 2016-12-15 DIAGNOSIS — C4441 Basal cell carcinoma of skin of scalp and neck: Secondary | ICD-10-CM | POA: Diagnosis not present

## 2016-12-15 DIAGNOSIS — C44212 Basal cell carcinoma of skin of right ear and external auricular canal: Secondary | ICD-10-CM | POA: Diagnosis not present

## 2016-12-15 DIAGNOSIS — Z85828 Personal history of other malignant neoplasm of skin: Secondary | ICD-10-CM | POA: Diagnosis not present

## 2016-12-31 DIAGNOSIS — L570 Actinic keratosis: Secondary | ICD-10-CM | POA: Diagnosis not present

## 2016-12-31 DIAGNOSIS — Z8673 Personal history of transient ischemic attack (TIA), and cerebral infarction without residual deficits: Secondary | ICD-10-CM | POA: Diagnosis not present

## 2016-12-31 DIAGNOSIS — E7800 Pure hypercholesterolemia, unspecified: Secondary | ICD-10-CM | POA: Diagnosis not present

## 2016-12-31 DIAGNOSIS — H4089 Other specified glaucoma: Secondary | ICD-10-CM | POA: Diagnosis not present

## 2016-12-31 DIAGNOSIS — I10 Essential (primary) hypertension: Secondary | ICD-10-CM | POA: Diagnosis not present

## 2016-12-31 DIAGNOSIS — D510 Vitamin B12 deficiency anemia due to intrinsic factor deficiency: Secondary | ICD-10-CM | POA: Diagnosis not present

## 2016-12-31 DIAGNOSIS — Z0000 Encounter for general adult medical examination without abnormal findings: Secondary | ICD-10-CM | POA: Diagnosis not present

## 2016-12-31 DIAGNOSIS — Z7984 Long term (current) use of oral hypoglycemic drugs: Secondary | ICD-10-CM | POA: Diagnosis not present

## 2016-12-31 DIAGNOSIS — E1149 Type 2 diabetes mellitus with other diabetic neurological complication: Secondary | ICD-10-CM | POA: Diagnosis not present

## 2016-12-31 DIAGNOSIS — R8290 Unspecified abnormal findings in urine: Secondary | ICD-10-CM | POA: Diagnosis not present

## 2017-01-29 DIAGNOSIS — D510 Vitamin B12 deficiency anemia due to intrinsic factor deficiency: Secondary | ICD-10-CM | POA: Diagnosis not present

## 2017-03-02 DIAGNOSIS — D510 Vitamin B12 deficiency anemia due to intrinsic factor deficiency: Secondary | ICD-10-CM | POA: Diagnosis not present

## 2017-03-23 DIAGNOSIS — E119 Type 2 diabetes mellitus without complications: Secondary | ICD-10-CM | POA: Diagnosis not present

## 2017-03-23 DIAGNOSIS — H401131 Primary open-angle glaucoma, bilateral, mild stage: Secondary | ICD-10-CM | POA: Diagnosis not present

## 2017-03-24 DIAGNOSIS — Z111 Encounter for screening for respiratory tuberculosis: Secondary | ICD-10-CM | POA: Diagnosis not present

## 2017-03-26 DIAGNOSIS — D510 Vitamin B12 deficiency anemia due to intrinsic factor deficiency: Secondary | ICD-10-CM | POA: Diagnosis not present

## 2017-03-26 DIAGNOSIS — I10 Essential (primary) hypertension: Secondary | ICD-10-CM | POA: Diagnosis not present

## 2017-04-06 ENCOUNTER — Telehealth: Payer: Self-pay | Admitting: Cardiovascular Disease

## 2017-04-06 NOTE — Telephone Encounter (Signed)
Spoke with patient's wife who states they are moving to a retirement community in [place] and will be transferring their cardiac care to [facility]. She thanked Dr. Acie Fredrickson for his many years of service.

## 2017-04-06 NOTE — Telephone Encounter (Signed)
° °  FYI   Patient is relocating to [facility], [place] and will be transferring care. Requesting a call back.

## 2017-05-27 DIAGNOSIS — F5102 Adjustment insomnia: Secondary | ICD-10-CM | POA: Diagnosis not present

## 2017-05-27 DIAGNOSIS — I10 Essential (primary) hypertension: Secondary | ICD-10-CM | POA: Diagnosis not present

## 2017-05-27 DIAGNOSIS — E1121 Type 2 diabetes mellitus with diabetic nephropathy: Secondary | ICD-10-CM | POA: Diagnosis not present

## 2017-05-27 DIAGNOSIS — E785 Hyperlipidemia, unspecified: Secondary | ICD-10-CM | POA: Diagnosis not present

## 2017-05-27 DIAGNOSIS — R809 Proteinuria, unspecified: Secondary | ICD-10-CM | POA: Diagnosis not present

## 2017-05-27 DIAGNOSIS — E538 Deficiency of other specified B group vitamins: Secondary | ICD-10-CM | POA: Diagnosis not present

## 2017-05-27 DIAGNOSIS — E1129 Type 2 diabetes mellitus with other diabetic kidney complication: Secondary | ICD-10-CM | POA: Diagnosis not present

## 2017-05-27 DIAGNOSIS — H409 Unspecified glaucoma: Secondary | ICD-10-CM | POA: Diagnosis not present

## 2017-06-03 DIAGNOSIS — E118 Type 2 diabetes mellitus with unspecified complications: Secondary | ICD-10-CM | POA: Diagnosis not present

## 2017-07-07 DIAGNOSIS — E1121 Type 2 diabetes mellitus with diabetic nephropathy: Secondary | ICD-10-CM | POA: Diagnosis not present

## 2017-07-07 DIAGNOSIS — E538 Deficiency of other specified B group vitamins: Secondary | ICD-10-CM | POA: Diagnosis not present

## 2017-07-22 DIAGNOSIS — H401133 Primary open-angle glaucoma, bilateral, severe stage: Secondary | ICD-10-CM | POA: Diagnosis not present

## 2017-07-30 DIAGNOSIS — D485 Neoplasm of uncertain behavior of skin: Secondary | ICD-10-CM | POA: Diagnosis not present

## 2017-07-30 DIAGNOSIS — C44319 Basal cell carcinoma of skin of other parts of face: Secondary | ICD-10-CM | POA: Diagnosis not present

## 2017-07-30 DIAGNOSIS — L821 Other seborrheic keratosis: Secondary | ICD-10-CM | POA: Diagnosis not present

## 2017-07-30 DIAGNOSIS — C4449 Other specified malignant neoplasm of skin of scalp and neck: Secondary | ICD-10-CM | POA: Diagnosis not present

## 2017-07-30 DIAGNOSIS — L718 Other rosacea: Secondary | ICD-10-CM | POA: Diagnosis not present

## 2017-07-30 DIAGNOSIS — L570 Actinic keratosis: Secondary | ICD-10-CM | POA: Diagnosis not present

## 2017-08-16 DIAGNOSIS — C44319 Basal cell carcinoma of skin of other parts of face: Secondary | ICD-10-CM | POA: Diagnosis not present

## 2017-08-23 DIAGNOSIS — H401133 Primary open-angle glaucoma, bilateral, severe stage: Secondary | ICD-10-CM | POA: Diagnosis not present

## 2017-08-25 DIAGNOSIS — C44319 Basal cell carcinoma of skin of other parts of face: Secondary | ICD-10-CM | POA: Diagnosis not present

## 2017-08-25 DIAGNOSIS — Z48817 Encounter for surgical aftercare following surgery on the skin and subcutaneous tissue: Secondary | ICD-10-CM | POA: Diagnosis not present

## 2017-08-26 DIAGNOSIS — F5101 Primary insomnia: Secondary | ICD-10-CM | POA: Diagnosis not present

## 2017-08-26 DIAGNOSIS — I10 Essential (primary) hypertension: Secondary | ICD-10-CM | POA: Diagnosis not present

## 2017-08-26 DIAGNOSIS — E118 Type 2 diabetes mellitus with unspecified complications: Secondary | ICD-10-CM | POA: Diagnosis not present

## 2017-08-30 DIAGNOSIS — Z48817 Encounter for surgical aftercare following surgery on the skin and subcutaneous tissue: Secondary | ICD-10-CM | POA: Diagnosis not present

## 2017-08-30 DIAGNOSIS — C4449 Other specified malignant neoplasm of skin of scalp and neck: Secondary | ICD-10-CM | POA: Diagnosis not present

## 2017-09-16 DIAGNOSIS — E118 Type 2 diabetes mellitus with unspecified complications: Secondary | ICD-10-CM | POA: Diagnosis not present

## 2017-12-02 DIAGNOSIS — E1121 Type 2 diabetes mellitus with diabetic nephropathy: Secondary | ICD-10-CM | POA: Diagnosis not present

## 2017-12-02 DIAGNOSIS — I10 Essential (primary) hypertension: Secondary | ICD-10-CM | POA: Diagnosis not present

## 2017-12-17 DIAGNOSIS — E119 Type 2 diabetes mellitus without complications: Secondary | ICD-10-CM | POA: Diagnosis not present

## 2017-12-17 DIAGNOSIS — I10 Essential (primary) hypertension: Secondary | ICD-10-CM | POA: Diagnosis not present

## 2017-12-23 DIAGNOSIS — I10 Essential (primary) hypertension: Secondary | ICD-10-CM | POA: Diagnosis not present

## 2017-12-23 DIAGNOSIS — N179 Acute kidney failure, unspecified: Secondary | ICD-10-CM | POA: Diagnosis not present

## 2017-12-23 DIAGNOSIS — N189 Chronic kidney disease, unspecified: Secondary | ICD-10-CM | POA: Diagnosis not present

## 2017-12-23 DIAGNOSIS — E119 Type 2 diabetes mellitus without complications: Secondary | ICD-10-CM | POA: Diagnosis not present

## 2017-12-30 DIAGNOSIS — H401133 Primary open-angle glaucoma, bilateral, severe stage: Secondary | ICD-10-CM | POA: Diagnosis not present

## 2017-12-31 DIAGNOSIS — Z08 Encounter for follow-up examination after completed treatment for malignant neoplasm: Secondary | ICD-10-CM | POA: Diagnosis not present

## 2017-12-31 DIAGNOSIS — L821 Other seborrheic keratosis: Secondary | ICD-10-CM | POA: Diagnosis not present

## 2017-12-31 DIAGNOSIS — D692 Other nonthrombocytopenic purpura: Secondary | ICD-10-CM | POA: Diagnosis not present

## 2017-12-31 DIAGNOSIS — L814 Other melanin hyperpigmentation: Secondary | ICD-10-CM | POA: Diagnosis not present

## 2017-12-31 DIAGNOSIS — X32XXXA Exposure to sunlight, initial encounter: Secondary | ICD-10-CM | POA: Diagnosis not present

## 2017-12-31 DIAGNOSIS — L570 Actinic keratosis: Secondary | ICD-10-CM | POA: Diagnosis not present

## 2017-12-31 DIAGNOSIS — Z85828 Personal history of other malignant neoplasm of skin: Secondary | ICD-10-CM | POA: Diagnosis not present

## 2018-01-12 DIAGNOSIS — N179 Acute kidney failure, unspecified: Secondary | ICD-10-CM | POA: Diagnosis not present

## 2018-02-13 DIAGNOSIS — Z23 Encounter for immunization: Secondary | ICD-10-CM | POA: Diagnosis not present

## 2018-03-21 DIAGNOSIS — E119 Type 2 diabetes mellitus without complications: Secondary | ICD-10-CM | POA: Diagnosis not present

## 2018-03-21 DIAGNOSIS — E871 Hypo-osmolality and hyponatremia: Secondary | ICD-10-CM | POA: Diagnosis not present

## 2018-03-24 DIAGNOSIS — H401133 Primary open-angle glaucoma, bilateral, severe stage: Secondary | ICD-10-CM | POA: Diagnosis not present

## 2018-05-04 DIAGNOSIS — C44319 Basal cell carcinoma of skin of other parts of face: Secondary | ICD-10-CM | POA: Diagnosis not present

## 2018-05-04 DIAGNOSIS — L821 Other seborrheic keratosis: Secondary | ICD-10-CM | POA: Diagnosis not present

## 2018-05-04 DIAGNOSIS — D485 Neoplasm of uncertain behavior of skin: Secondary | ICD-10-CM | POA: Diagnosis not present

## 2018-05-04 DIAGNOSIS — L570 Actinic keratosis: Secondary | ICD-10-CM | POA: Diagnosis not present

## 2018-05-04 DIAGNOSIS — Z08 Encounter for follow-up examination after completed treatment for malignant neoplasm: Secondary | ICD-10-CM | POA: Diagnosis not present

## 2018-05-04 DIAGNOSIS — L853 Xerosis cutis: Secondary | ICD-10-CM | POA: Diagnosis not present

## 2018-05-04 DIAGNOSIS — X32XXXA Exposure to sunlight, initial encounter: Secondary | ICD-10-CM | POA: Diagnosis not present

## 2018-05-04 DIAGNOSIS — Z85828 Personal history of other malignant neoplasm of skin: Secondary | ICD-10-CM | POA: Diagnosis not present

## 2018-05-04 DIAGNOSIS — S0080XA Unspecified superficial injury of other part of head, initial encounter: Secondary | ICD-10-CM | POA: Diagnosis not present
